# Patient Record
Sex: Male | Born: 1965 | Race: White | Hispanic: No | Marital: Married | Smoking: Never smoker
Health system: Southern US, Community
[De-identification: ages and names within clinical notes are randomized; demographics above are authoritative.]

## PROBLEM LIST (undated history)

## (undated) DIAGNOSIS — T7840XA Allergy, unspecified, initial encounter: Secondary | ICD-10-CM

## (undated) DIAGNOSIS — M5126 Other intervertebral disc displacement, lumbar region: Secondary | ICD-10-CM

## (undated) DIAGNOSIS — K449 Diaphragmatic hernia without obstruction or gangrene: Secondary | ICD-10-CM

## (undated) DIAGNOSIS — K222 Esophageal obstruction: Secondary | ICD-10-CM

## (undated) DIAGNOSIS — Z9109 Other allergy status, other than to drugs and biological substances: Secondary | ICD-10-CM

## (undated) DIAGNOSIS — K200 Eosinophilic esophagitis: Secondary | ICD-10-CM

## (undated) HISTORY — PX: APPENDECTOMY: SHX54

## (undated) HISTORY — DX: Esophageal obstruction: K22.2

## (undated) HISTORY — DX: Diaphragmatic hernia without obstruction or gangrene: K44.9

## (undated) HISTORY — DX: Allergy, unspecified, initial encounter: T78.40XA

## (undated) HISTORY — DX: Other allergy status, other than to drugs and biological substances: Z91.09

## (undated) HISTORY — DX: Eosinophilic esophagitis: K20.0

## (undated) HISTORY — PX: UPPER GASTROINTESTINAL ENDOSCOPY: SHX188

## (undated) HISTORY — DX: Other intervertebral disc displacement, lumbar region: M51.26

---

## 2005-09-25 ENCOUNTER — Emergency Department (HOSPITAL_COMMUNITY): Admission: EM | Admit: 2005-09-25 | Discharge: 2005-09-25 | Payer: Self-pay | Admitting: Emergency Medicine

## 2006-01-21 ENCOUNTER — Ambulatory Visit: Payer: Self-pay | Admitting: Family Medicine

## 2006-06-23 ENCOUNTER — Emergency Department (HOSPITAL_COMMUNITY): Admission: EM | Admit: 2006-06-23 | Discharge: 2006-06-23 | Payer: Self-pay | Admitting: Emergency Medicine

## 2007-04-12 ENCOUNTER — Ambulatory Visit: Payer: Self-pay | Admitting: Family Medicine

## 2007-04-14 ENCOUNTER — Ambulatory Visit: Payer: Self-pay | Admitting: Family Medicine

## 2007-04-16 ENCOUNTER — Ambulatory Visit: Payer: Self-pay | Admitting: Family Medicine

## 2007-04-21 ENCOUNTER — Ambulatory Visit: Payer: Self-pay | Admitting: Family Medicine

## 2007-04-29 ENCOUNTER — Ambulatory Visit: Payer: Self-pay | Admitting: Family Medicine

## 2007-07-14 DIAGNOSIS — J309 Allergic rhinitis, unspecified: Secondary | ICD-10-CM | POA: Insufficient documentation

## 2008-08-04 ENCOUNTER — Ambulatory Visit: Payer: Self-pay | Admitting: Family Medicine

## 2008-08-04 LAB — CONVERTED CEMR LAB
ALT: 21 units/L (ref 0–53)
AST: 11 units/L (ref 0–37)
Albumin: 4.1 g/dL (ref 3.5–5.2)
Alkaline Phosphatase: 55 units/L (ref 39–117)
BUN: 12 mg/dL (ref 6–23)
Basophils Absolute: 0 10*3/uL (ref 0.0–0.1)
Basophils Relative: 0.8 % (ref 0.0–3.0)
Bilirubin Urine: NEGATIVE
Bilirubin, Direct: 0.1 mg/dL (ref 0.0–0.3)
Blood in Urine, dipstick: NEGATIVE
CO2: 31 meq/L (ref 19–32)
Calcium: 8.9 mg/dL (ref 8.4–10.5)
Chloride: 105 meq/L (ref 96–112)
Cholesterol: 149 mg/dL (ref 0–200)
Creatinine, Ser: 1 mg/dL (ref 0.4–1.5)
Eosinophils Absolute: 0.2 10*3/uL (ref 0.0–0.7)
Eosinophils Relative: 4.5 % (ref 0.0–5.0)
GFR calc Af Amer: 106 mL/min
GFR calc non Af Amer: 88 mL/min
Glucose, Bld: 97 mg/dL (ref 70–99)
Glucose, Urine, Semiquant: NEGATIVE
HCT: 39 % (ref 39.0–52.0)
HDL: 28.6 mg/dL — ABNORMAL LOW (ref >39.0)
Hemoglobin: 13.9 g/dL (ref 13.0–17.0)
Ketones, urine, test strip: NEGATIVE
LDL Cholesterol: 91 mg/dL (ref 0–99)
Lymphocytes Relative: 19.9 % (ref 12.0–46.0)
MCHC: 35.8 g/dL (ref 30.0–36.0)
MCV: 88.9 fL (ref 78.0–100.0)
Monocytes Absolute: 0.3 10*3/uL (ref 0.1–1.0)
Monocytes Relative: 5.8 % (ref 3.0–12.0)
Neutro Abs: 3.5 10*3/uL (ref 1.4–7.7)
Neutrophils Relative %: 69 % (ref 43.0–77.0)
Nitrite: NEGATIVE
Platelets: 199 10*3/uL (ref 150–400)
Potassium: 4.4 meq/L (ref 3.5–5.1)
RBC: 4.39 M/uL (ref 4.22–5.81)
RDW: 12.2 % (ref 11.5–14.6)
Sodium: 141 meq/L (ref 135–145)
Specific Gravity, Urine: 1.02
TSH: 1.51 microintl units/mL (ref 0.35–5.50)
Total Bilirubin: 0.8 mg/dL (ref 0.3–1.2)
Total CHOL/HDL Ratio: 5.2
Total Protein: 6.4 g/dL (ref 6.0–8.3)
Triglycerides: 147 mg/dL (ref 0–149)
Urobilinogen, UA: 0.2
VLDL: 29 mg/dL (ref 0–40)
WBC Urine, dipstick: NEGATIVE
WBC: 5 10*3/uL (ref 4.5–10.5)
pH: 7

## 2008-08-09 ENCOUNTER — Encounter: Payer: Self-pay | Admitting: Family Medicine

## 2008-08-10 ENCOUNTER — Ambulatory Visit: Payer: Self-pay | Admitting: Family Medicine

## 2008-08-17 ENCOUNTER — Telehealth: Payer: Self-pay | Admitting: Family Medicine

## 2009-02-21 ENCOUNTER — Ambulatory Visit: Payer: Self-pay | Admitting: Family Medicine

## 2009-02-21 DIAGNOSIS — G5790 Unspecified mononeuropathy of unspecified lower limb: Secondary | ICD-10-CM | POA: Insufficient documentation

## 2009-02-23 ENCOUNTER — Telehealth: Payer: Self-pay | Admitting: Family Medicine

## 2009-02-23 LAB — CONVERTED CEMR LAB
ALT: 14 units/L (ref 0–53)
AST: 11 units/L (ref 0–37)
Albumin: 3.8 g/dL (ref 3.5–5.2)
Alkaline Phosphatase: 54 units/L (ref 39–117)
BUN: 10 mg/dL (ref 6–23)
Basophils Absolute: 0 10*3/uL (ref 0.0–0.1)
Basophils Relative: 1 % (ref 0.0–3.0)
Bilirubin, Direct: 0.1 mg/dL (ref 0.0–0.3)
CO2: 32 meq/L (ref 19–32)
Calcium: 8.9 mg/dL (ref 8.4–10.5)
Chloride: 110 meq/L (ref 96–112)
Creatinine, Ser: 0.8 mg/dL (ref 0.4–1.5)
Eosinophils Absolute: 0.3 10*3/uL (ref 0.0–0.7)
Eosinophils Relative: 6.6 % — ABNORMAL HIGH (ref 0.0–5.0)
Folate: 4.5 ng/mL
GFR calc non Af Amer: 113.34 mL/min (ref >60)
Glucose, Bld: 79 mg/dL (ref 70–99)
HCT: 38.1 % — ABNORMAL LOW (ref 39.0–52.0)
Hemoglobin: 13.6 g/dL (ref 13.0–17.0)
Iron: 115 ug/dL (ref 42–165)
Lymphocytes Relative: 22.6 % (ref 12.0–46.0)
Lymphs Abs: 1.1 10*3/uL (ref 0.7–4.0)
MCHC: 35.8 g/dL (ref 30.0–36.0)
MCV: 89.8 fL (ref 78.0–100.0)
Monocytes Absolute: 0.4 10*3/uL (ref 0.1–1.0)
Monocytes Relative: 7.5 % (ref 3.0–12.0)
Neutro Abs: 3.2 10*3/uL (ref 1.4–7.7)
Neutrophils Relative %: 62.3 % (ref 43.0–77.0)
Platelets: 177 10*3/uL (ref 150.0–400.0)
Potassium: 3.9 meq/L (ref 3.5–5.1)
RBC: 4.23 M/uL (ref 4.22–5.81)
RDW: 12.4 % (ref 11.5–14.6)
Saturation Ratios: 37 % (ref 20.0–50.0)
Sodium: 145 meq/L (ref 135–145)
TSH: 1.57 microintl units/mL (ref 0.35–5.50)
Total Bilirubin: 0.7 mg/dL (ref 0.3–1.2)
Total Protein: 6 g/dL (ref 6.0–8.3)
Transferrin: 222.2 mg/dL (ref 212.0–360.0)
Vitamin B-12: 225 pg/mL (ref 211–911)
WBC: 5 10*3/uL (ref 4.5–10.5)

## 2009-02-28 ENCOUNTER — Ambulatory Visit: Payer: Self-pay | Admitting: Family Medicine

## 2009-02-28 DIAGNOSIS — D518 Other vitamin B12 deficiency anemias: Secondary | ICD-10-CM | POA: Insufficient documentation

## 2009-08-16 ENCOUNTER — Ambulatory Visit: Payer: Self-pay | Admitting: Family Medicine

## 2009-08-16 DIAGNOSIS — M5137 Other intervertebral disc degeneration, lumbosacral region: Secondary | ICD-10-CM

## 2009-08-16 DIAGNOSIS — M51379 Other intervertebral disc degeneration, lumbosacral region without mention of lumbar back pain or lower extremity pain: Secondary | ICD-10-CM | POA: Insufficient documentation

## 2010-04-23 ENCOUNTER — Ambulatory Visit: Payer: Self-pay | Admitting: Family Medicine

## 2010-05-29 ENCOUNTER — Ambulatory Visit: Payer: Self-pay | Admitting: Internal Medicine

## 2010-06-11 ENCOUNTER — Encounter: Payer: Self-pay | Admitting: Internal Medicine

## 2010-07-18 ENCOUNTER — Encounter: Admission: RE | Admit: 2010-07-18 | Discharge: 2010-07-18 | Payer: Self-pay | Admitting: Pulmonary Disease

## 2010-08-27 ENCOUNTER — Ambulatory Visit
Admission: RE | Admit: 2010-08-27 | Discharge: 2010-08-27 | Payer: Self-pay | Source: Home / Self Care | Admitting: Otolaryngology

## 2010-12-03 NOTE — Letter (Signed)
Summary: [facility]  [facility]   Imported By: Maryln Gottron 07/29/2010 15:07:50  _____________________________________________________________________  External Attachment:    Type:   Image     Comment:   External Document

## 2010-12-03 NOTE — Assessment & Plan Note (Signed)
Summary: fu on allergies/njr   Vital Signs:  Patient profile:   45 year old male Weight:      215 pounds Temp:     98.2 degrees F oral BP sitting:   114 / 80  (right arm) Cuff size:   regular  Vitals Entered By: Duard Brady LPN (May 29, 2010 11:07 AM) CC: c/o cough - non productive , waking at night r/t sinus drainage - otc zyrtec/claritin not working Is Patient Diabetic? No   CC:  c/o cough - non productive  and waking at night r/t sinus drainage - otc zyrtec/claritin not working.  History of Present Illness: 45 year old patient who is seen today complaining of significant allergy symptoms.  He states that he has considerable rhinorrhea, causing largely nonproductive cough.  He states that he has a difficult time sleeping due to the drainage and associated coughing.  He has been treated with oral prednisone in the past without benefit.  He also states that nonsedating antihistamines have not been helpful.  He has been on nasal steroids in the past, also, without much benefit and he is requesting referral to an allergist.  He states that he has had the symptoms for 30 years.  There's been no fever or purulent sinus drainage or productive cough.  Denies any focal, sinus pain  Preventive Screening-Counseling & Management  Alcohol-Tobacco     Smoking Status: never  Allergies (verified): No Known Drug Allergies  Past History:  Past Medical History: Reviewed history from 07/14/2007 and no changes required. Allergic rhinitis  Review of Systems       The patient complains of prolonged cough.  The patient denies anorexia, fever, weight loss, weight gain, vision loss, decreased hearing, hoarseness, chest pain, syncope, dyspnea on exertion, peripheral edema, headaches, hemoptysis, abdominal pain, melena, hematochezia, severe indigestion/heartburn, hematuria, incontinence, genital sores, muscle weakness, suspicious skin lesions, transient blindness, difficulty walking, depression,  unusual weight change, abnormal bleeding, enlarged lymph nodes, angioedema, breast masses, and testicular masses.    Physical Exam  General:  Well-developed,well-nourished,in no acute distress; alert,appropriate and cooperative throughout examination Head:  Normocephalic and atraumatic without obvious abnormalities. No apparent alopecia or balding. Eyes:  No corneal or conjunctival inflammation noted. EOMI. Perrla. Funduscopic exam benign, without hemorrhages, exudates or papilledema. Vision grossly normal. Ears:  External ear exam shows no significant lesions or deformities.  Otoscopic examination reveals clear canals, tympanic membranes are intact bilaterally without bulging, retraction, inflammation or discharge. Hearing is grossly normal bilaterally. Mouth:  Oral mucosa and oropharynx without lesions or exudates.  Teeth in good repair. Neck:  No deformities, masses, or tenderness noted. Lungs:  Normal respiratory effort, chest expands symmetrically. Lungs are clear to auscultation, no crackles or wheezes. Heart:  Normal rate and regular rhythm. S1 and S2 normal without gallop, murmur, click, rub or other extra sounds.   Impression & Recommendations:  Problem # 1:  ALLERGIC RHINITIS (ICD-477.9)  His updated medication list for this problem includes:    Fluticasone Propionate 50 Mcg/act Susp (Fluticasone propionate) ..... Use daily  Orders: Allergy Referral  (Allergy) Depo- Medrol 80mg  (J1040) Admin of Therapeutic Inj  intramuscular or subcutaneous (69629)  Complete Medication List: 1)  Cyanocobalamin 1000 Mcg/ml Soln (Cyanocobalamin) .... Inject 1cc once a week for four weeks and then once a month thereafter 2)  Bd Eclipse Syringe 23g X 1" 3 Ml Misc (Syringe/needle (disp)) .... Use to inject b12 as directed by your doctor 3)  Fluticasone Propionate 50 Mcg/act Susp (Fluticasone propionate) .... Use daily  Patient Instructions: 1)  allergy referral as  discussed Prescriptions: FLUTICASONE PROPIONATE 50 MCG/ACT SUSP (FLUTICASONE PROPIONATE) use daily  #1 x 4   Entered and Authorized by:   Gordy Savers  MD   Signed by:   Gordy Savers  MD on 05/29/2010   Method used:   Electronically to        [facility]       [address]       [place], [place]  [address]       Ph: [phone]       Fax: [phone]   RxID:   [id number]    Medication Administration  Injection # 1:    Medication: Depo- Medrol 80mg     Diagnosis: ALLERGIC RHINITIS (ICD-477.9)    Route: IM    Site: R deltoid    Exp Date: 02/2013    Lot #: [id number]    Mfr: Pharmacia    Patient tolerated injection without complications    Given by: Duard Brady LPN (May 29, 2010 11:57 AM)  Orders Added: 1)  Est. Patient Level III [84132] 2)  Allergy Referral  [Allergy] 3)  Depo- Medrol 80mg  [J1040] 4)  Admin of Therapeutic Inj  intramuscular or subcutaneous [44010]

## 2010-12-03 NOTE — Letter (Signed)
Summary: Out of Work  [facility] [employer]  [address]   [place], [place] [address]   Phone: [phone]  Fax: [phone]    April 23, 2010   Employee:  Devin Ramos    To Whom It May Concern:   For Medical reasons, please excuse the above named employee from work for the following dates:  Start:   April 23, 2010  End:   April 24, 2010  If you need additional information, please feel free to contact our office.         Sincerely,    Kelle Darting, MD

## 2010-12-03 NOTE — Assessment & Plan Note (Signed)
Summary: URI/dm   Vital Signs:  Patient profile:   45 year old male Height:      74 inches Weight:      215 pounds BMI:     27.70 Temp:     99.0 degrees F oral BP sitting:   110 / 78  (left arm) Cuff size:   regular  Vitals Entered By: Kern Reap CMA Duncan Dull) (April 23, 2010 12:06 PM) CC: head and chest congestion   CC:  head and chest congestion.  History of Present Illness: ted is a 45 year old, married male, nonsmoker, who comes in today for a flare of his allergic rhinitis.  He has had congestion, postnasal drip, and cough.  No fever, et Karie Soda, et Karie Soda.  He does take Claritin daily because of perennial allergic rhinitis  Allergies: No Known Drug Allergies  Past History:  Past medical, surgical, family and social histories (including risk factors) reviewed for relevance to current acute and chronic problems.  Past Medical History: Reviewed history from 07/14/2007 and no changes required. Allergic rhinitis  Family History: Reviewed history from 08/10/2008 and no changes required. Family History Diabetes 1st degree relative Family History of Prostate CA 1st degree relative <50  Social History: Reviewed history from 08/10/2008 and no changes required. Occupation: Married Never Smoked Alcohol use-no Drug use-no Regular exercise-yes  Review of Systems      See HPI  Physical Exam  General:  Well-developed,well-nourished,in no acute distress; alert,appropriate and cooperative throughout examination Head:  Normocephalic and atraumatic without obvious abnormalities. No apparent alopecia or balding. Eyes:  No corneal or conjunctival inflammation noted. EOMI. Perrla. Funduscopic exam benign, without hemorrhages, exudates or papilledema. Vision grossly normal. Ears:  External ear exam shows no significant lesions or deformities.  Otoscopic examination reveals clear canals, tympanic membranes are intact bilaterally without bulging, retraction, inflammation or  discharge. Hearing is grossly normal bilaterally. Nose:  External nasal examination shows no deformity or inflammation. Nasal mucosa are pink and moist without lesions or exudates. Mouth:  Oral mucosa and oropharynx without lesions or exudates.  Teeth in good repair. Neck:  No deformities, masses, or tenderness noted. Chest Wall:  No deformities, masses, tenderness or gynecomastia noted. Lungs:  Normal respiratory effort, chest expands symmetrically. Lungs are clear to auscultation, no crackles or wheezes.   Impression & Recommendations:  Problem # 1:  ALLERGIC RHINITIS (ICD-477.9) Assessment Deteriorated  Orders: Prescription Created Electronically [phone])  Complete Medication List: 1)  Cyanocobalamin 1000 Mcg/ml Soln (Cyanocobalamin) .... Inject 1cc once a week for four weeks and then once a month thereafter 2)  Bd Eclipse Syringe 23g X 1" 3 Ml Misc (Syringe/needle (disp)) .... Use to inject b12 as directed by your doctor 3)  Prednisone 20 Mg Tabs (Prednisone) .... Uad  Patient Instructions: 1)  stop the Claritin temporarily, and begin prednisone one tab x 3 days, a half a tablet x 3 days, then half a tablet Monday, Wednesday, Friday, for a two week taper.  When he finished the prednisone, then restart the Claritin Prescriptions: PREDNISONE 20 MG TABS (PREDNISONE) UAD  #30 x 1   Entered and Authorized by:   Roderick Pee MD   Signed by:   Roderick Pee MD on 04/23/2010   Method used:   Electronically to        [facility]       [address]       [place], [place]  [address]  Ph: [phone]       Fax: [phone]   RxID:   [id number]

## 2011-01-15 LAB — POCT HEMOGLOBIN-HEMACUE: Hemoglobin: 14.2 g/dL (ref 13.0–17.0)

## 2011-11-18 ENCOUNTER — Ambulatory Visit (INDEPENDENT_AMBULATORY_CARE_PROVIDER_SITE_OTHER): Payer: BC Managed Care – PPO | Admitting: Family Medicine

## 2011-11-18 ENCOUNTER — Encounter: Payer: Self-pay | Admitting: Family Medicine

## 2011-11-18 VITALS — BP 98/62 | Temp 98.5°F | Ht 74.0 in | Wt 216.0 lb

## 2011-11-18 DIAGNOSIS — J069 Acute upper respiratory infection, unspecified: Secondary | ICD-10-CM

## 2011-11-18 MED ORDER — HYDROCODONE-HOMATROPINE 5-1.5 MG/5ML PO SYRP: ORAL_SOLUTION | ORAL | Status: DC

## 2011-11-18 NOTE — Patient Instructions (Signed)
Drink lots of liquids.  Run a vaporizer or humidifier in your bedroom at night.  Hydromet one half to 1 teaspoon at bedtime p.r.n. For cough and cold symptoms.  Afrin nasal spray, one shot up each nostril at bedtime for the next 5 nights, then stop

## 2011-11-18 NOTE — Progress Notes (Signed)
  Subjective:    Patient ID: Devin Ramos, male    DOB: 02-12-1966, 46 y.o.   MRN: [id number]  HPI Devin Ramos is a 46 year old, married man, nonsmoker, who comes in today with a week's history of head congestion, postnasal drip, and cough.  He has no fever or aches et Karie Soda.  He is a nonsmoker.   Review of Systems    General ENT and pulmonary review of systems otherwise negative Objective:   Physical Exam  Well-developed and nourished, male in no acute distress.  HEENT negative.  Neck supple.  No adenopathy.  Lungs are clear      Assessment & Plan:  DOI asymptomatically with liquids, Tylenol Hydromet

## 2012-02-20 ENCOUNTER — Telehealth: Payer: Self-pay | Admitting: *Deleted

## 2012-02-20 NOTE — Telephone Encounter (Signed)
Suggest  Saturday clinic

## 2012-02-20 NOTE — Telephone Encounter (Signed)
Pt works Saturday also, so he will try an Urgent Care after work hours.

## 2012-02-20 NOTE — Telephone Encounter (Signed)
Pt has been using Claritin and Tylenol Sinus for 2 weeks with no improvement.  Has congestion with cough (green), and nasal congestion.  Asking to be treated by phone as he cannot get off work for one week.

## 2012-10-06 ENCOUNTER — Encounter (HOSPITAL_BASED_OUTPATIENT_CLINIC_OR_DEPARTMENT_OTHER): Payer: Self-pay | Admitting: *Deleted

## 2012-10-06 ENCOUNTER — Emergency Department (HOSPITAL_BASED_OUTPATIENT_CLINIC_OR_DEPARTMENT_OTHER)
Admission: EM | Admit: 2012-10-06 | Discharge: 2012-10-06 | Disposition: A | Discharge status: Home / Self Care | Payer: Self-pay | Attending: Emergency Medicine | Admitting: Emergency Medicine

## 2012-10-06 ENCOUNTER — Emergency Department (HOSPITAL_BASED_OUTPATIENT_CLINIC_OR_DEPARTMENT_OTHER): Payer: Self-pay

## 2012-10-06 DIAGNOSIS — S52599A Other fractures of lower end of unspecified radius, initial encounter for closed fracture: Secondary | ICD-10-CM | POA: Insufficient documentation

## 2012-10-06 DIAGNOSIS — S99929A Unspecified injury of unspecified foot, initial encounter: Secondary | ICD-10-CM | POA: Insufficient documentation

## 2012-10-06 DIAGNOSIS — S8990XA Unspecified injury of unspecified lower leg, initial encounter: Secondary | ICD-10-CM | POA: Insufficient documentation

## 2012-10-06 DIAGNOSIS — W11XXXA Fall on and from ladder, initial encounter: Secondary | ICD-10-CM | POA: Insufficient documentation

## 2012-10-06 DIAGNOSIS — Y939 Activity, unspecified: Secondary | ICD-10-CM | POA: Insufficient documentation

## 2012-10-06 DIAGNOSIS — S52509A Unspecified fracture of the lower end of unspecified radius, initial encounter for closed fracture: Secondary | ICD-10-CM

## 2012-10-06 DIAGNOSIS — Y929 Unspecified place or not applicable: Secondary | ICD-10-CM | POA: Insufficient documentation

## 2012-10-06 MED ORDER — HYDROCODONE-ACETAMINOPHEN 5-325 MG PO TABS: 1.0000 | ORAL_TABLET | Freq: Four times a day (QID) | ORAL | Status: DC | PRN

## 2012-10-06 MED ORDER — HYDROCODONE-ACETAMINOPHEN 5-325 MG PO TABS
1.0000 | ORAL_TABLET | Freq: Once | ORAL | Status: AC
  Administered 2012-10-06: 1 via ORAL
  Filled 2012-10-06: qty 1

## 2012-10-06 NOTE — ED Notes (Signed)
Fall from 2nd step on ladder x 1 day ago, c/o right wrist , left foot pain

## 2012-10-06 NOTE — ED Notes (Signed)
MD at bedside. EDP [place] at [facility] r/t family member concerns with velcro splint

## 2012-10-06 NOTE — ED Notes (Signed)
MD at bedside. 

## 2012-10-06 NOTE — ED Provider Notes (Signed)
History     CSN: [id number]  Arrival date & time 10/06/12  1757   First MD Initiated Contact with Patient 10/06/12 1901      Chief Complaint  Patient presents with  . Fall    (Consider location/radiation/quality/duration/timing/severity/associated sxs/prior treatment) HPI The patient presents one day after a fall with pain in his right wrist, left foot.  He states that he was on a ladder, lost his balance, fell onto his right side, catching himself with his right arm.  Since that time there's been pain in his right wrist, left foot.  He has been ambulatory.  The pain in both areas is sore, not improved with OTC medication.  He denies distal dysesthesia or weakness.  He denies any other head trauma, loss of consciousness, any other focal complaints.    History reviewed. No pertinent past medical history.  Past Surgical History  Procedure Date  . Appendectomy     History reviewed. No pertinent family history.  History  Substance Use Topics  . Smoking status: Never Smoker   . Smokeless tobacco: Not on file  . Alcohol Use: No      Review of Systems  All other systems reviewed and are negative.    Allergies  Review of patient's allergies indicates no known allergies.  Home Medications   Current Outpatient Rx  Name  Route  Sig  Dispense  Refill  . CYANOCOBALAMIN 1000 MCG/ML IJ SOLN   Intramuscular   Inject 1,000 mcg into the muscle every 30 (thirty) days.         [facility] HYDROCODONE-ACETAMINOPHEN 5-325 MG PO TABS   Oral   Take 1 tablet by mouth every 6 (six) hours as needed for pain.   15 tablet   0   . HYDROCODONE-HOMATROPINE 5-1.5 MG/5ML PO SYRP      One half to 1 teaspoon at bedtime p.r.n. For cough and cold   240 mL   1     BP 141/73  Pulse 80  Temp 98.2 F (36.8 C) (Oral)  Resp 16  Ht 6\' 2"  (1.88 m)  Wt 215 lb (97.523 kg)  BMI 27.60 kg/m2  SpO2 99%  Physical Exam  Nursing note and vitals reviewed. Constitutional: He is oriented to person,  place, and time. He appears well-developed. No distress.  HENT:  Head: Normocephalic and atraumatic.  Eyes: Conjunctivae normal and EOM are normal.  Cardiovascular: Normal rate and regular rhythm.   Pulmonary/Chest: Effort normal. No stridor. No respiratory distress.  Abdominal: He exhibits no distension.  Musculoskeletal: He exhibits no edema.       Right elbow: Normal.      Right wrist: He exhibits tenderness, bony tenderness and swelling. He exhibits no crepitus and no laceration.       Left wrist: Normal.       Left ankle: Normal.       Feet:       The patient has tenderness to palpation diffusely about the wrist, hesitancy demented, though neurologically he can move it in all dimensions, minimally.  The patient can also flex and extend all digits on the hand.  There is tenderness to palpation about the distal fourth digit, with slight ecchymosis, but no loss of function of the digits.  Neurological: He is alert and oriented to person, place, and time.  Skin: Skin is warm and dry.  Psychiatric: He has a normal mood and affect.    ED Course  ORTHOPEDIC INJURY TREATMENT Date/Time: 10/06/2012 7:25 PM Performed by:  Gerhard Munch Authorized by: Gerhard Munch Consent: Verbal consent obtained. The procedure was performed in an emergent situation. Risks and benefits: risks, benefits and alternatives were discussed Consent given by: patient Patient identity confirmed: verbally with patient Time out: Immediately prior to procedure a "time out" was called to verify the correct patient, procedure, equipment, support staff and site/side marked as required. Injury location: wrist Location details: right wrist Injury type: fracture Fracture type: distal radius Pre-procedure neurovascular assessment: neurovascularly intact Pre-procedure distal perfusion: normal Pre-procedure neurological function: normal Pre-procedure range of motion: reduced Local anesthesia used: no Patient sedated:  no Manipulation performed: no Immobilization: splint Splint type: volar short arm Post-procedure neurovascular assessment: post-procedure neurovascularly intact Post-procedure distal perfusion: normal Post-procedure neurological function: normal Post-procedure range of motion: unchanged Patient tolerance: Patient tolerated the procedure well with no immediate complications.   (including critical care time)  Labs Reviewed - No data to display Dg Wrist Complete Right  10/06/2012  *RADIOLOGY REPORT*  Clinical Data: Fall.  Wrist injury and pain.  RIGHT WRIST - COMPLETE 3+ VIEW  Comparison: None.  Findings: A nondisplaced fracture is seen through the radial styloid process with intra-articular extension into the radiocarpal joint.  No other fractures are identified.  Alignment is normal.  IMPRESSION: Nondisplaced radial styloid process fracture with intra-articular extension.   Original Report Authenticated By: Myles Rosenthal, M.D.    Dg Hand Complete Right  10/06/2012  *RADIOLOGY REPORT*  Clinical Data: Fall  RIGHT HAND - COMPLETE 3+ VIEW  Comparison: Right wrist same day  Findings: Three views of the right hand submitted. No hand fracture or subluxation.  Again noted nondisplaced fracture of distal right radius.  IMPRESSION: No hand fracture or subluxation.   Original Report Authenticated By: Natasha Mead, M.D.    Dg Foot Complete Left  10/06/2012  *RADIOLOGY REPORT*  Clinical Data: Fall.  Foot injury and pain.  LEFT FOOT - COMPLETE 3+ VIEW  Comparison:  None.  Findings:  There is no evidence of fracture or dislocation.  There is no evidence of arthropathy or other focal bone abnormality. Soft tissues are unremarkable.  IMPRESSION: Negative.   Original Report Authenticated By: Myles Rosenthal, M.D.      1. Radius distal fracture     I interpreted the x-rays, demonstrated them to the patient and his wife.  MDM  This previously well male presents one day after a fall with pain in his right wrist, right  fourth digit, left third toe.  Patient's x-rays demonstrate fracture of the distal radius.  Given the patient's need to continue working, he received a short arm immobilization device.  I discussed the need for minimal stress of the extremity.  The patient was discharged in stable condition with analgesics, orthopedics followup     Gerhard Munch, MD 10/06/12 [phone]

## 2013-04-21 ENCOUNTER — Ambulatory Visit (INDEPENDENT_AMBULATORY_CARE_PROVIDER_SITE_OTHER): Payer: BC Managed Care – PPO | Admitting: Family Medicine

## 2013-04-21 ENCOUNTER — Encounter: Payer: Self-pay | Admitting: Family Medicine

## 2013-04-21 VITALS — BP 106/60 | HR 71 | Temp 98.8°F | Ht 74.0 in | Wt 219.6 lb

## 2013-04-21 DIAGNOSIS — Z0000 Encounter for general adult medical examination without abnormal findings: Secondary | ICD-10-CM

## 2013-04-21 NOTE — Patient Instructions (Signed)
Preventive Care for Adults, Male  A healthy lifestyle and preventive care can promote health and wellness. Preventive health guidelines for men include the following key practices:  · A routine yearly physical is a good way to check with your caregiver about your health and preventative screening. It is a chance to share any concerns and updates on your health, and to receive a thorough exam.  · Visit your dentist for a routine exam and preventative care every 6 months. Brush your teeth twice a day and floss once a day. Good oral hygiene prevents tooth decay and gum disease.  · The frequency of eye exams is based on your age, health, family medical history, use of contact lenses, and other factors. Follow your caregiver's recommendations for frequency of eye exams.  · Eat a healthy diet. Foods like vegetables, fruits, whole grains, low-fat dairy products, and lean protein foods contain the nutrients you need without too many calories. Decrease your intake of foods high in solid fats, added sugars, and salt. Eat the right amount of calories for you. Get information about a proper diet from your caregiver, if necessary.  · Regular physical exercise is one of the most important things you can do for your health. Most adults should get at least 150 minutes of moderate-intensity exercise (any activity that increases your heart rate and causes you to sweat) each week. In addition, most adults need muscle-strengthening exercises on 2 or more days a week.  · Maintain a healthy weight. The body mass index (BMI) is a screening tool to identify possible weight problems. It provides an estimate of body fat based on height and weight. Your caregiver can help determine your BMI, and can help you achieve or maintain a healthy weight. For adults 20 years and older:  · A BMI below 18.5 is considered underweight.  · A BMI of 18.5 to 24.9 is normal.  · A BMI of 25 to 29.9 is considered overweight.  · A BMI of 30 and above is  considered obese.  · Maintain normal blood lipids and cholesterol levels by exercising and minimizing your intake of saturated fat. Eat a balanced diet with plenty of fruit and vegetables. Blood tests for lipids and cholesterol should begin at age 20 and be repeated every 5 years. If your lipid or cholesterol levels are high, you are over 50, or you are a high risk for heart disease, you may need your cholesterol levels checked more frequently. Ongoing high lipid and cholesterol levels should be treated with medicines if diet and exercise are not effective.  · If you smoke, find out from your caregiver how to quit. If you do not use tobacco, do not start.  · If you choose to drink alcohol, do not exceed 2 drinks per day. One drink is considered to be 12 ounces (355 mL) of beer, 5 ounces (148 mL) of wine, or 1.5 ounces (44 mL) of liquor.  · Avoid use of street drugs. Do not share needles with anyone. Ask for help if you need support or instructions about stopping the use of drugs.  · High blood pressure causes heart disease and increases the risk of stroke. Your blood pressure should be checked at least every 1 to 2 years. Ongoing high blood pressure should be treated with medicines, if weight loss and exercise are not effective.  · If you are 45 to 45 years old, ask your caregiver if you should take aspirin to prevent heart disease.  · Diabetes screening involves taking   a blood sample to check your fasting blood sugar level. This should be done once every 3 years, after age 45, if you are within normal weight and without risk factors for diabetes. Testing should be considered at a younger age or be carried out more frequently if you are overweight and have at least 1 risk factor for diabetes.  · Colorectal cancer can be detected and often prevented. Most routine colorectal cancer screening begins at the age of 50 and continues through age 75. However, your caregiver may recommend screening at an earlier age if you  have risk factors for colon cancer. On a yearly basis, your caregiver may provide home test kits to check for hidden blood in the stool. Use of a small camera at the end of a tube, to directly examine the colon (sigmoidoscopy or colonoscopy), can detect the earliest forms of colorectal cancer. Talk to your caregiver about this at age 50, when routine screening begins.  Direct examination of the colon should be repeated every 5 to 10 years through age 75, unless early forms of pre-cancerous polyps or small growths are found.  · Hepatitis C blood testing is recommended for all people born from 1945 through 1965 and any individual with known risks for hepatitis C.  · Practice safe sex. Use condoms and avoid high-risk sexual practices to reduce the spread of sexually transmitted infections (STIs). STIs include gonorrhea, chlamydia, syphilis, trichomonas, herpes, HPV, and human immunodeficiency virus (HIV). Herpes, HIV, and HPV are viral illnesses that have no cure. They can result in disability, cancer, and death.  · A one-time screening for abdominal aortic aneurysm (AAA) and surgical repair of large AAAs by sound wave imaging (ultrasonography) is recommended for ages 65 to 75 years who are current or former smokers.  · Healthy men should no longer receive prostate-specific antigen (PSA) blood tests as part of routine cancer screening. Consult with your caregiver about prostate cancer screening.  · Testicular cancer screening is not recommended for adult males who have no symptoms. Screening includes self-exam, caregiver exam, and other screening tests. Consult with your caregiver about any symptoms you have or any concerns you have about testicular cancer.  · Use sunscreen with skin protection factor (SPF) of 30 or more. Apply sunscreen liberally and repeatedly throughout the day. You should seek shade when your shadow is shorter than you. Protect yourself by wearing long sleeves, pants, a wide-brimmed hat, and  sunglasses year round, whenever you are outdoors.  · Once a month, do a whole body skin exam, using a mirror to look at the skin on your back. Notify your caregiver of new moles, moles that have irregular borders, moles that are larger than a pencil eraser, or moles that have changed in shape or color.  · Stay current with required immunizations.  · Influenza. You need a dose every fall (or winter). The composition of the flu vaccine changes each year, so being vaccinated once is not enough.  · Pneumococcal polysaccharide. You need 1 to 2 doses if you smoke cigarettes or if you have certain chronic medical conditions. You need 1 dose at age 65 (or older) if you have never been vaccinated.  · Tetanus, diphtheria, pertussis (Tdap, Td). Get 1 dose of Tdap vaccine if you are younger than age 65 years, are over 65 and have contact with an infant, are a healthcare worker, or simply want to be protected from whooping cough. After that, you need a Td booster dose every 10 years. Consult your   caregiver if you have not had at least 3 tetanus and diphtheria-containing shots sometime in your life or have a deep or dirty wound.  · HPV. This vaccine is recommended for males 13 through 45 years of age. This vaccine may be given to men 22 through 45 years of age who have not completed the 3 dose series. It is recommended for men through age 26 who have sex with men or whose immune system is weakened because of HIV infection, other illness, or medications. The vaccine is given in 3 doses over 6 months.  · Measles, mumps, rubella (MMR). You need at least 1 dose of MMR if you were born in 1957 or later. You may also need a 2nd dose.  · Meningococcal. If you are age 19 to 21 years and a first-year college student living in a residence hall, or have one of several medical conditions, you need to get vaccinated against meningococcal disease. You may also need additional booster doses.  · Zoster (shingles). If you are age 60 years or  older, you should get this vaccine.  · Varicella (chickenpox). If you have never had chickenpox or you were vaccinated but received only 1 dose, talk to your caregiver to find out if you need this vaccine.  · Hepatitis A. You need this vaccine if you have a specific risk factor for hepatitis A virus infection, or you simply wish to be protected from this disease. The vaccine is usually given as 2 doses, 6 to 18 months apart.  · Hepatitis B. You need this vaccine if you have a specific risk factor for hepatitis B virus infection or you simply wish to be protected from this disease. The vaccine is given in 3 doses, usually over 6 months.  Preventative Service / Frequency  Ages 19 to 39  · Blood pressure check.** / Every 1 to 2 years.  · Lipid and cholesterol check.** / Every 5 years beginning at age 20.  · Hepatitis C blood test.** / For any individual with known risks for hepatitis C.  · Skin self-exam. / Monthly.  · Influenza immunization.** / Every year.  · Pneumococcal polysaccharide immunization.** / 1 to 2 doses if you smoke cigarettes or if you have certain chronic medical conditions.  · Tetanus, diphtheria, pertussis (Tdap,Td) immunization. / A one-time dose of Tdap vaccine. After that, you need a Td booster dose every 10 years.  · HPV immunization. / 3 doses over 6 months, if 26 and younger.  · Measles, mumps, rubella (MMR) immunization. / You need at least 1 dose of MMR if you were born in 1957 or later. You may also need a 2nd dose.  · Meningococcal immunization. / 1 dose if you are age 19 to 21 years and a first-year college student living in a residence hall, or have one of several medical conditions, you need to get vaccinated against meningococcal disease. You may also need additional booster doses.  · Varicella immunization.** / Consult your caregiver.  · Hepatitis A immunization.** / Consult your caregiver. 2 doses, 6 to 18 months apart.  · Hepatitis B immunization.** / Consult your caregiver. 3 doses  usually over 6 months.  Ages 40 to 64  · Blood pressure check.** / Every 1 to 2 years.  · Lipid and cholesterol check.** / Every 5 years beginning at age 20.  · Fecal occult blood test (FOBT) of stool. / Every year beginning at age 50 and continuing until age 75. You may not have   to do this test if you get colonoscopy every 10 years.  · Flexible sigmoidoscopy** or colonoscopy.** / Every 5 years for a flexible sigmoidoscopy or every 10 years for a colonoscopy beginning at age 50 and continuing until age 75.  · Hepatitis C blood test.** / For all people born from 1945 through 1965 and any individual with known risks for hepatitis C.  · Skin self-exam. / Monthly.  · Influenza immunization.** / Every year.  · Pneumococcal polysaccharide immunization.** / 1 to 2 doses if you smoke cigarettes or if you have certain chronic medical conditions.  · Tetanus, diphtheria, pertussis (Tdap/Td) immunization.** / A one-time dose of Tdap vaccine. After that, you need a Td booster dose every 10 years.  · Measles, mumps, rubella (MMR) immunization.  / You need at least 1 dose of MMR if you were born in 1957 or later. You may also need a 2nd dose.  · Varicella immunization.**/ Consult your caregiver.  · Meningococcal immunization.** / Consult your caregiver.  · Hepatitis A immunization.** / Consult your caregiver. 2 doses, 6 to 18 months apart.  · Hepatitis B immunization.** / Consult your caregiver. 3 doses, usually over 6 months.  Ages 65 and over  · Blood pressure check.** / Every 1 to 2 years.  · Lipid and cholesterol check.**/ Every 5 years beginning at age 20.  · Fecal occult blood test (FOBT) of stool. / Every year beginning at age 50 and continuing until age 75. You may not have to do this test if you get colonoscopy every 10 years.  · Flexible sigmoidoscopy** or colonoscopy.** / Every 5 years for a flexible sigmoidoscopy or every 10 years for a colonoscopy beginning at age 50 and continuing until age 75.  · Hepatitis C blood  test.** / For all people born from 1945 through 1965 and any individual with known risks for hepatitis C.  · Abdominal aortic aneurysm (AAA) screening.** / A one-time screening for ages 65 to 75 years who are current or former smokers.  · Skin self-exam. / Monthly.  · Influenza immunization.** / Every year.  · Pneumococcal polysaccharide immunization.** / 1 dose at age 65 (or older) if you have never been vaccinated.  · Tetanus, diphtheria, pertussis (Tdap, Td) immunization. / A one-time dose of Tdap vaccine if you are over 65 and have contact with an infant, are a healthcare worker, or simply want to be protected from whooping cough. After that, you need a Td booster dose every 10 years.  · Varicella immunization. ** / Consult your caregiver.  · Meningococcal immunization.** / Consult your caregiver.  · Hepatitis A immunization. ** / Consult your caregiver. 2 doses, 6 to 18 months apart.  · Hepatitis B immunization.** / Check with your caregiver. 3 doses, usually over 6 months.  **Family history and personal history of risk and conditions may change your caregiver's recommendations.  Document Released: 12/16/2001 Document Revised: 01/12/2012 Document Reviewed: 03/17/2011  [employer]® Patient Information ©2014 [employer].

## 2013-04-21 NOTE — Progress Notes (Signed)
Subjective:    Patient ID: Devin Ramos, male    DOB: 07-Apr-1966, 47 y.o.   MRN: [id number]  HPI Pt here for cpe and labs.  No complaints.     Review of Systems Review of Systems  Constitutional: Negative for activity change, appetite change and fatigue.  HENT: Negative for hearing loss, congestion, tinnitus and ear discharge.  dentist q86m Eyes: Negative for visual disturbance (see optho q1y -- vision corrected to 20/20 with glasses).  Respiratory: Negative for cough, chest tightness and shortness of breath.   Cardiovascular: Negative for chest pain, palpitations and leg swelling.  Gastrointestinal: Negative for abdominal pain, diarrhea, constipation and abdominal distention.  Genitourinary: Negative for urgency, frequency, decreased urine volume and difficulty urinating.  Musculoskeletal: Negative for back pain, arthralgias and gait problem.  Skin: Negative for color change, pallor and rash.  Neurological: Negative for dizziness, light-headedness, numbness and headaches.  Hematological: Negative for adenopathy. Does not bruise/bleed easily.  Psychiatric/Behavioral: Negative for suicidal ideas, confusion, sleep disturbance, self-injury, dysphoric mood, decreased concentration and agitation.     Past Medical History  Diagnosis Date  . Environmental allergies    History   Social History  . Marital Status: Married    Spouse Name: N/A    Number of Children: N/A  . Years of Education: N/A   Occupational History  . [employer]    Social History Main Topics  . Smoking status: Never Smoker   . Smokeless tobacco: Not on file  . Alcohol Use: No     Comment: rare  . Drug Use: No  . Sexually Active: Yes -- Male partner(s)   Other Topics Concern  . Not on file   Social History Narrative   Exercise-- walking ,  Physical job   Family History  Problem Relation Age of Onset  . Prostate cancer Father   . Hypertension Father   . Heart disease Paternal Grandfather   .  Stroke Paternal Grandfather   . Hypertension Mother   . Diabetes Mother    Past Surgical History  Procedure Laterality Date  . Appendectomy         Objective:   Physical Exam BP 106/60  Pulse 71  Temp(Src) 98.8 F (37.1 C) (Oral)  Ht 6\' 2"  (1.88 m)  Wt 219 lb 9.6 oz (99.61 kg)  BMI 28.18 kg/m2  SpO2 97% General appearance: alert, cooperative, appears stated age and no distress Head: Normocephalic, without obvious abnormality, atraumatic Eyes: negative findings: lids and lashes normal and pupils equal, round, reactive to light and accomodation Ears: normal TM's and external ear canals both ears Nose: Nares normal. Septum midline. Mucosa normal. No drainage or sinus tenderness. Throat: lips, mucosa, and tongue normal; teeth and gums normal Neck: no adenopathy, no carotid bruit, no JVD, supple, symmetrical, trachea midline and thyroid not enlarged, symmetric, no tenderness/mass/nodules Back: symmetric, no curvature. ROM normal. No CVA tenderness. Lungs: clear to auscultation bilaterally Chest wall: no tenderness Heart: S1, S2 normal Abdomen: soft, non-tender; bowel sounds normal; no masses,  no organomegaly Male genitalia: normal, penis: no lesions or discharge. testes: no masses or tenderness. no hernias Rectal: normal tone, normal prostate, no masses or tenderness and soft brown guaiac negative stool noted Extremities: extremities normal, atraumatic, no cyanosis or edema Pulses: 2+ and symmetric Skin: Skin color, texture, turgor normal. No rashes or lesions Lymph nodes: Cervical, supraclavicular, and axillary nodes normal. Neurologic: Alert and oriented X 3, normal strength and tone. Normal symmetric reflexes. Normal coordination and gait Psych- no depression, no  anxiety       Assessment & Plan:  cpe--  ghm utd           Check fasting labs            See AVS

## 2013-04-28 ENCOUNTER — Other Ambulatory Visit (INDEPENDENT_AMBULATORY_CARE_PROVIDER_SITE_OTHER): Payer: BC Managed Care – PPO

## 2013-04-28 DIAGNOSIS — Z0000 Encounter for general adult medical examination without abnormal findings: Secondary | ICD-10-CM

## 2013-04-28 LAB — CBC WITH DIFFERENTIAL/PLATELET
Basophils Absolute: 0.1 10*3/uL (ref 0.0–0.1)
Basophils Relative: 1.3 % (ref 0.0–3.0)
Eosinophils Absolute: 0.3 10*3/uL (ref 0.0–0.7)
Eosinophils Relative: 5.6 % — ABNORMAL HIGH (ref 0.0–5.0)
HCT: 37.8 % — ABNORMAL LOW (ref 39.0–52.0)
Hemoglobin: 13.1 g/dL (ref 13.0–17.0)
Lymphocytes Relative: 21.7 % (ref 12.0–46.0)
MCHC: 34.8 g/dL (ref 30.0–36.0)
MCV: 89.5 fl (ref 78.0–100.0)
Monocytes Absolute: 0.3 10*3/uL (ref 0.1–1.0)
Monocytes Relative: 5 % (ref 3.0–12.0)
Neutro Abs: 3.5 10*3/uL (ref 1.4–7.7)
Neutrophils Relative %: 66.4 % (ref 43.0–77.0)
Platelets: 180 10*3/uL (ref 150.0–400.0)
RBC: 4.22 Mil/uL (ref 4.22–5.81)
RDW: 13.3 % (ref 11.5–14.6)
WBC: 5.2 10*3/uL (ref 4.5–10.5)

## 2013-04-28 LAB — LIPID PANEL
Cholesterol: 158 mg/dL (ref 0–200)
HDL: 29.9 mg/dL — ABNORMAL LOW (ref >39.00)
LDL Cholesterol: 101 mg/dL — ABNORMAL HIGH (ref 0–99)
Total CHOL/HDL Ratio: 5
Triglycerides: 134 mg/dL (ref 0.0–149.0)
VLDL: 26.8 mg/dL (ref 0.0–40.0)

## 2013-04-28 LAB — HEPATIC FUNCTION PANEL
ALT: 17 U/L (ref 0–53)
AST: 10 U/L (ref 0–37)
Albumin: 4.1 g/dL (ref 3.5–5.2)
Alkaline Phosphatase: 55 U/L (ref 39–117)
Total Bilirubin: 0.5 mg/dL (ref 0.3–1.2)

## 2013-04-28 LAB — BASIC METABOLIC PANEL
BUN: 13 mg/dL (ref 6–23)
CO2: 27 mEq/L (ref 19–32)
Calcium: 9 mg/dL (ref 8.4–10.5)
Creatinine, Ser: 0.8 mg/dL (ref 0.4–1.5)
GFR: 105.03 mL/min (ref >60.00)
Glucose, Bld: 99 mg/dL (ref 70–99)
Potassium: 4.2 mEq/L (ref 3.5–5.1)
Sodium: 140 mEq/L (ref 135–145)

## 2013-04-28 LAB — TSH: TSH: 1.67 u[IU]/mL (ref 0.35–5.50)

## 2013-04-28 LAB — PSA: PSA: 0.43 ng/mL (ref 0.10–4.00)

## 2013-09-16 ENCOUNTER — Encounter: Payer: Self-pay | Admitting: Family Medicine

## 2013-09-16 ENCOUNTER — Ambulatory Visit (INDEPENDENT_AMBULATORY_CARE_PROVIDER_SITE_OTHER): Payer: BC Managed Care – PPO | Admitting: Family Medicine

## 2013-09-16 VITALS — BP 118/80 | HR 78 | Temp 97.1°F | Wt 222.0 lb

## 2013-09-16 DIAGNOSIS — M79642 Pain in left hand: Secondary | ICD-10-CM | POA: Insufficient documentation

## 2013-09-16 DIAGNOSIS — E538 Deficiency of other specified B group vitamins: Secondary | ICD-10-CM

## 2013-09-16 DIAGNOSIS — M79609 Pain in unspecified limb: Secondary | ICD-10-CM

## 2013-09-16 NOTE — Progress Notes (Signed)
Pre visit review using our clinic review tool, if applicable. No additional management support is needed unless otherwise documented below in the visit note. 

## 2013-09-16 NOTE — Patient Instructions (Addendum)
                                         rto prn  If symptoms occur again --call the office and we will refer you to a hand specialist

## 2013-09-16 NOTE — Assessment & Plan Note (Signed)
?   Carpal spasm If occurs again we will refer to hand specialist

## 2013-09-17 NOTE — Progress Notes (Signed)
  Subjective:    Devin Ramos is a 47 y.o. male who presents for evaluation of left hand/finger pain. Onset was sudden, related to working overhead under car. Mechanism of injury: gripping and repetitive use pattern. The pain was moderate, worsens with movement, and is relieved by time. There is associated numbness, tingling in L palm and fingers. Evaluation to date: none. Treatment to date: nothing specific.  The following portions of the patient's history were reviewed and updated as appropriate: allergies, current medications, past family history, past medical history, past social history, past surgical history and problem list.  Review of Systems Pertinent items are noted in HPI.    Objective:    BP 118/80  Pulse 78  Temp(Src) 97.1 F (36.2 C) (Tympanic)  Wt 222 lb (100.699 kg)  SpO2 97% Right hand:  normal exam, no swelling, tenderness, instability; ligaments intact, full ROM both hands, wrists, and finger joints  Left hand:  normal exam, no swelling, tenderness, instability; ligaments intact, full ROM both hands, wrists, and finger joints   Imaging: X-ray left hand: not indicated    Assessment:  L hand pain--  carpal spasm--?    Plan:    Natural history and expected course discussed. Questions answered. Transport planner distributed. Rest, ice, compression, and elevation (RICE) therapy. Reduction in offending activity discussed. will refer to hand surgeon if occurs again

## 2013-09-28 ENCOUNTER — Emergency Department (HOSPITAL_BASED_OUTPATIENT_CLINIC_OR_DEPARTMENT_OTHER)
Admission: EM | Admit: 2013-09-28 | Discharge: 2013-09-28 | Disposition: A | Discharge status: Home / Self Care | Payer: BC Managed Care – PPO | Attending: Emergency Medicine | Admitting: Emergency Medicine

## 2013-09-28 ENCOUNTER — Encounter (HOSPITAL_BASED_OUTPATIENT_CLINIC_OR_DEPARTMENT_OTHER): Payer: Self-pay | Admitting: Emergency Medicine

## 2013-09-28 DIAGNOSIS — L509 Urticaria, unspecified: Secondary | ICD-10-CM | POA: Insufficient documentation

## 2013-09-28 MED ORDER — PREDNISONE 50 MG PO TABS
60.0000 mg | ORAL_TABLET | Freq: Once | ORAL | Status: AC
  Administered 2013-09-28: 60 mg via ORAL
  Filled 2013-09-28 (×2): qty 1

## 2013-09-28 MED ORDER — DIPHENHYDRAMINE HCL 25 MG PO CAPS
50.0000 mg | ORAL_CAPSULE | Freq: Once | ORAL | Status: AC
  Administered 2013-09-28: 50 mg via ORAL
  Filled 2013-09-28: qty 2

## 2013-09-28 MED ORDER — FAMOTIDINE 20 MG PO TABS
20.0000 mg | ORAL_TABLET | Freq: Once | ORAL | Status: AC
  Administered 2013-09-28: 20 mg via ORAL
  Filled 2013-09-28: qty 1

## 2013-09-28 MED ORDER — PREDNISONE 20 MG PO TABS: 60.0000 mg | ORAL_TABLET | Freq: Every day | ORAL | Status: DC

## 2013-09-28 NOTE — ED Notes (Signed)
Pt c/o rash to entire body and feet itching x 2 days

## 2013-09-28 NOTE — ED Provider Notes (Signed)
CSN: [id number]     Arrival date & time 09/28/13  1849 History   First MD Initiated Contact with Patient 09/28/13 1902     Chief Complaint  Patient presents with  . Rash   (Consider location/radiation/quality/duration/timing/severity/associated sxs/prior Treatment) Patient is a 47 y.o. male presenting with rash. The history is provided by the patient. No language interpreter was used.  Rash Location:  Full body Quality: itchiness and redness   Severity:  Moderate Associated symptoms: no fever   Associated symptoms comment:  Generalized rash x 2 days. No known ingestions that have caused previous reactions. No shortness of breath or wheezing, no tongue or lip swelling. No difficulty swallowing.   Past Medical History  Diagnosis Date  . Environmental allergies    Past Surgical History  Procedure Laterality Date  . Appendectomy     Family History  Problem Relation Age of Onset  . Prostate cancer Father   . Hypertension Father   . Heart disease Paternal Grandfather   . Stroke Paternal Grandfather   . Hypertension Mother   . Diabetes Mother    History  Substance Use Topics  . Smoking status: Never Smoker   . Smokeless tobacco: Not on file  . Alcohol Use: No     Comment: rare    Review of Systems  Constitutional: Negative for fever and chills.  HENT: Negative.   Respiratory: Negative.   Skin: Positive for rash.       See HPI.  Neurological: Negative.     Allergies  Review of patient's allergies indicates no known allergies.  Home Medications  No current outpatient prescriptions on file. BP 128/76  Pulse 100  Temp(Src) 98.6 F (37 C) (Oral)  Resp 16  Ht 6\' 2"  (1.88 m)  Wt 220 lb (99.791 kg)  BMI 28.23 kg/m2  SpO2 98% Physical Exam  Constitutional: He appears well-developed and well-nourished.  HENT:  Head: Normocephalic.  Neck: Normal range of motion. Neck supple.  Cardiovascular: Normal rate and regular rhythm.   Pulmonary/Chest: Effort normal and  breath sounds normal.  Abdominal: Soft. Bowel sounds are normal. There is no tenderness. There is no rebound and no guarding.  Musculoskeletal: Normal range of motion.  Neurological: He is alert. No cranial nerve deficit.  Skin: Skin is warm and dry. Rash noted.  Generalized red rash in welts c/w hives.  Psychiatric: He has a normal mood and affect.    ED Course  Procedures (including critical care time) Labs Review Labs Reviewed - No data to display Imaging Review No results found.  EKG Interpretation   None       MDM  No diagnosis found. 1. Allergic reaction  Reaction limited to rash without SOB or oropharyngeal symptoms. Stable for discharge - Benadryl, prednisone, pepcid.    Arnoldo Hooker, PA-C 09/28/13 1931

## 2013-09-28 NOTE — ED Notes (Signed)
Has been taken over counter cold med, denies changes in every day products

## 2013-09-28 NOTE — ED Provider Notes (Signed)
Medical screening examination/treatment/procedure(s) were performed by non-physician practitioner and as supervising physician I was immediately available for consultation/collaboration.  EKG Interpretation   None         Walsie Smeltz B. Janina Trafton, MD 09/28/13 2324 

## 2013-09-28 NOTE — ED Notes (Signed)
Rash over body noticed this am, last pm feet were itching and this am hands itching  Did not notice body rash till this pm  Denies diff breathing or swallowing

## 2014-06-01 ENCOUNTER — Encounter: Payer: Self-pay | Admitting: Family Medicine

## 2014-06-01 ENCOUNTER — Ambulatory Visit (INDEPENDENT_AMBULATORY_CARE_PROVIDER_SITE_OTHER): Payer: BC Managed Care – PPO | Admitting: Family Medicine

## 2014-06-01 VITALS — BP 100/60 | HR 77 | Temp 97.9°F | Ht 74.75 in | Wt 211.0 lb

## 2014-06-01 DIAGNOSIS — Z23 Encounter for immunization: Secondary | ICD-10-CM

## 2014-06-01 DIAGNOSIS — Z0000 Encounter for general adult medical examination without abnormal findings: Secondary | ICD-10-CM

## 2014-06-01 LAB — POCT URINALYSIS DIPSTICK
BILIRUBIN UA: NEGATIVE
Blood, UA: NEGATIVE
Glucose, UA: NEGATIVE
Ketones, UA: NEGATIVE
LEUKOCYTES UA: NEGATIVE
NITRITE UA: NEGATIVE
Protein, UA: NEGATIVE
Spec Grav, UA: 1.025
UROBILINOGEN UA: 0.2
pH, UA: 6

## 2014-06-01 LAB — BASIC METABOLIC PANEL
BUN: 10 mg/dL (ref 6–23)
CALCIUM: 8.9 mg/dL (ref 8.4–10.5)
CO2: 29 mEq/L (ref 19–32)
Chloride: 106 mEq/L (ref 96–112)
Creatinine, Ser: 0.9 mg/dL (ref 0.4–1.5)
GFR: 103.1 mL/min (ref >60.00)
GLUCOSE: 88 mg/dL (ref 70–99)
Potassium: 3.8 mEq/L (ref 3.5–5.1)
Sodium: 140 mEq/L (ref 135–145)

## 2014-06-01 LAB — CBC WITH DIFFERENTIAL/PLATELET
BASOS ABS: 0 10*3/uL (ref 0.0–0.1)
Basophils Relative: 0.6 % (ref 0.0–3.0)
Eosinophils Absolute: 0.3 10*3/uL (ref 0.0–0.7)
Eosinophils Relative: 5.5 % — ABNORMAL HIGH (ref 0.0–5.0)
HCT: 38.5 % — ABNORMAL LOW (ref 39.0–52.0)
Hemoglobin: 13.5 g/dL (ref 13.0–17.0)
LYMPHS PCT: 20.3 % (ref 12.0–46.0)
Lymphs Abs: 1.3 10*3/uL (ref 0.7–4.0)
MCHC: 34.9 g/dL (ref 30.0–36.0)
MCV: 89.6 fl (ref 78.0–100.0)
MONOS PCT: 4.9 % (ref 3.0–12.0)
Monocytes Absolute: 0.3 10*3/uL (ref 0.1–1.0)
Neutro Abs: 4.3 10*3/uL (ref 1.4–7.7)
Neutrophils Relative %: 68.7 % (ref 43.0–77.0)
Platelets: 205 10*3/uL (ref 150.0–400.0)
RBC: 4.3 Mil/uL (ref 4.22–5.81)
RDW: 12.7 % (ref 11.5–15.5)
WBC: 6.2 10*3/uL (ref 4.0–10.5)

## 2014-06-01 LAB — TSH: TSH: 2.09 u[IU]/mL (ref 0.35–4.50)

## 2014-06-01 LAB — HEPATIC FUNCTION PANEL
ALBUMIN: 4 g/dL (ref 3.5–5.2)
ALK PHOS: 56 U/L (ref 39–117)
ALT: 21 U/L (ref 0–53)
AST: 10 U/L (ref 0–37)
Bilirubin, Direct: 0.1 mg/dL (ref 0.0–0.3)
Total Bilirubin: 0.6 mg/dL (ref 0.2–1.2)
Total Protein: 6.4 g/dL (ref 6.0–8.3)

## 2014-06-01 LAB — LIPID PANEL
CHOL/HDL RATIO: 4
Cholesterol: 153 mg/dL (ref 0–200)
HDL: 34.7 mg/dL — ABNORMAL LOW (ref >39.00)
LDL Cholesterol: 97 mg/dL (ref 0–99)
NONHDL: 118.3
Triglycerides: 109 mg/dL (ref 0.0–149.0)
VLDL: 21.8 mg/dL (ref 0.0–40.0)

## 2014-06-01 LAB — PSA: PSA: 1.33 ng/mL (ref 0.10–4.00)

## 2014-06-01 NOTE — Progress Notes (Signed)
Pre visit review using our clinic review tool, if applicable. No additional management support is needed unless otherwise documented below in the visit note. 

## 2014-06-01 NOTE — Progress Notes (Signed)
Subjective:    Patient ID: Devin Ramos, male    DOB: 06/11/1966, 48 y.o.   MRN: [id number]  HPI Pt here for cpe --no complaints.     Review of Systems  Constitutional: Negative.   HENT: Negative for congestion, ear pain, hearing loss, nosebleeds, postnasal drip, rhinorrhea, sinus pressure, sneezing and tinnitus.   Eyes: Negative for photophobia, discharge, itching and visual disturbance.  Respiratory: Negative.   Cardiovascular: Negative.   Gastrointestinal: Negative for abdominal pain, constipation, blood in stool, abdominal distention and anal bleeding.  Endocrine: Negative.   Genitourinary: Negative.   Musculoskeletal: Negative.   Skin: Negative.   Allergic/Immunologic: Negative.   Neurological: Negative for dizziness, weakness, light-headedness, numbness and headaches.  Psychiatric/Behavioral: Negative for suicidal ideas, confusion, sleep disturbance, dysphoric mood, decreased concentration and agitation. The patient is not nervous/anxious.    Past Medical History  Diagnosis Date  . Environmental allergies    History   Social History  . Marital Status: Married    Spouse Name: N/A    Number of Children: N/A  . Years of Education: N/A   Occupational History  . [employer]    Social History Main Topics  . Smoking status: Never Smoker   . Smokeless tobacco: Not on file  . Alcohol Use: No     Comment: rare  . Drug Use: No  . Sexual Activity: Yes    Partners: Female   Other Topics Concern  . Not on file   Social History Narrative   Exercise-- walking ,  Physical job   No current outpatient prescriptions on file.   No current facility-administered medications for this visit.   Family History  Problem Relation Age of Onset  . Prostate cancer Father   . Hypertension Father   . Heart disease Paternal Grandfather   . Stroke Paternal Grandfather   . Hypertension Mother   . Diabetes Mother        Objective:   Physical Exam  Constitutional: He is  oriented to person, place, and time. He appears well-developed and well-nourished. No distress.  HENT:  Head: Normocephalic and atraumatic.  Right Ear: External ear normal.  Left Ear: External ear normal.  Nose: Nose normal.  Mouth/Throat: Oropharynx is clear and moist. No oropharyngeal exudate.  Eyes: Conjunctivae and EOM are normal. Pupils are equal, round, and reactive to light. Right eye exhibits no discharge. Left eye exhibits no discharge.  Neck: Normal range of motion. Neck supple. No JVD present. No thyromegaly present.  Cardiovascular: Normal rate, regular rhythm and intact distal pulses.  Exam reveals no gallop and no friction rub.   No murmur heard. Pulmonary/Chest: Effort normal and breath sounds normal. No respiratory distress. He has no wheezes. He has no rales. He exhibits no tenderness.  Abdominal: Soft. Bowel sounds are normal. He exhibits no distension and no mass. There is no tenderness. There is no rebound and no guarding.  Genitourinary: Rectum normal, prostate normal and penis normal. Guaiac negative stool.  Musculoskeletal: Normal range of motion. He exhibits no edema and no tenderness.  Lymphadenopathy:    He has no cervical adenopathy.  Neurological: He is alert and oriented to person, place, and time. He displays normal reflexes. He exhibits normal muscle tone.  Skin: Skin is warm and dry. No rash noted. He is not diaphoretic. No erythema. No pallor.  Psychiatric: He has a normal mood and affect. His behavior is normal. Judgment and thought content normal.          Assessment &  Plan:  cpe-- ghm utd             Check labs       Tdap given Self testicular exam ho given to pt

## 2014-06-01 NOTE — Patient Instructions (Signed)
Preventive Care for Adults A healthy lifestyle and preventive care can promote health and wellness. Preventive health guidelines for men include the following key practices:  A routine yearly physical is a good way to check with your health care provider about your health and preventative screening. It is a chance to share any concerns and updates on your health and to receive a thorough exam.  Visit your dentist for a routine exam and preventative care every 6 months. Brush your teeth twice a day and floss once a day. Good oral hygiene prevents tooth decay and gum disease.  The frequency of eye exams is based on your age, health, family medical history, use of contact lenses, and other factors. Follow your health care provider's recommendations for frequency of eye exams.  Eat a healthy diet. Foods such as vegetables, fruits, whole grains, low-fat dairy products, and lean protein foods contain the nutrients you need without too many calories. Decrease your intake of foods high in solid fats, added sugars, and salt. Eat the right amount of calories for you.Get information about a proper diet from your health care provider, if necessary.  Regular physical exercise is one of the most important things you can do for your health. Most adults should get at least 150 minutes of moderate-intensity exercise (any activity that increases your heart rate and causes you to sweat) each week. In addition, most adults need muscle-strengthening exercises on 2 or more days a week.  Maintain a healthy weight. The body mass index (BMI) is a screening tool to identify possible weight problems. It provides an estimate of body fat based on height and weight. Your health care provider can find your BMI and can help you achieve or maintain a healthy weight.For adults 20 years and older:  A BMI below 18.5 is considered underweight.  A BMI of 18.5 to 24.9 is normal.  A BMI of 25 to 29.9 is considered overweight.  A BMI  of 30 and above is considered obese.  Maintain normal blood lipids and cholesterol levels by exercising and minimizing your intake of saturated fat. Eat a balanced diet with plenty of fruit and vegetables. Blood tests for lipids and cholesterol should begin at age 50 and be repeated every 5 years. If your lipid or cholesterol levels are high, you are over 50, or you are at high risk for heart disease, you may need your cholesterol levels checked more frequently.Ongoing high lipid and cholesterol levels should be treated with medicines if diet and exercise are not working.  If you smoke, find out from your health care provider how to quit. If you do not use tobacco, do not start.  Lung cancer screening is recommended for adults aged 73-80 years who are at high risk for developing lung cancer because of a history of smoking. A yearly low-dose CT scan of the lungs is recommended for people who have at least a 30-pack-year history of smoking and are a current smoker or have quit within the past 15 years. A pack year of smoking is smoking an average of 1 pack of cigarettes a day for 1 year (for example: 1 pack a day for 30 years or 2 packs a day for 15 years). Yearly screening should continue until the smoker has stopped smoking for at least 15 years. Yearly screening should be stopped for people who develop a health problem that would prevent them from having lung cancer treatment.  If you choose to drink alcohol, do not have more than  2 drinks per day. One drink is considered to be 12 ounces (355 mL) of beer, 5 ounces (148 mL) of wine, or 1.5 ounces (44 mL) of liquor.  Avoid use of street drugs. Do not share needles with anyone. Ask for help if you need support or instructions about stopping the use of drugs.  High blood pressure causes heart disease and increases the risk of stroke. Your blood pressure should be checked at least every 1-2 years. Ongoing high blood pressure should be treated with  medicines, if weight loss and exercise are not effective.  If you are 45-79 years old, ask your health care provider if you should take aspirin to prevent heart disease.  Diabetes screening involves taking a blood sample to check your fasting blood sugar level. This should be done once every 3 years, after age 45, if you are within normal weight and without risk factors for diabetes. Testing should be considered at a younger age or be carried out more frequently if you are overweight and have at least 1 risk factor for diabetes.  Colorectal cancer can be detected and often prevented. Most routine colorectal cancer screening begins at the age of 50 and continues through age 75. However, your health care provider may recommend screening at an earlier age if you have risk factors for colon cancer. On a yearly basis, your health care provider may provide home test kits to check for hidden blood in the stool. Use of a small camera at the end of a tube to directly examine the colon (sigmoidoscopy or colonoscopy) can detect the earliest forms of colorectal cancer. Talk to your health care provider about this at age 50, when routine screening begins. Direct exam of the colon should be repeated every 5-10 years through age 75, unless early forms of precancerous polyps or small growths are found.  People who are at an increased risk for hepatitis B should be screened for this virus. You are considered at high risk for hepatitis B if:  You were born in a country where hepatitis B occurs often. Talk with your health care provider about which countries are considered high risk.  Your parents were born in a high-risk country and you have not received a shot to protect against hepatitis B (hepatitis B vaccine).  You have HIV or AIDS.  You use needles to inject street drugs.  You live with, or have sex with, someone who has hepatitis B.  You are a man who has sex with other men (MSM).  You get hemodialysis  treatment.  You take certain medicines for conditions such as cancer, organ transplantation, and autoimmune conditions.  Hepatitis C blood testing is recommended for all people born from 1945 through 1965 and any individual with known risks for hepatitis C.  Practice safe sex. Use condoms and avoid high-risk sexual practices to reduce the spread of sexually transmitted infections (STIs). STIs include gonorrhea, chlamydia, syphilis, trichomonas, herpes, HPV, and human immunodeficiency virus (HIV). Herpes, HIV, and HPV are viral illnesses that have no cure. They can result in disability, cancer, and death.  If you are at risk of being infected with HIV, it is recommended that you take a prescription medicine daily to prevent HIV infection. This is called preexposure prophylaxis (PrEP). You are considered at risk if:  You are a man who has sex with other men (MSM) and have other risk factors.  You are a heterosexual man, are sexually active, and are at increased risk for HIV infection.    You take drugs by injection.  You are sexually active with a partner who has HIV.  Talk with your health care provider about whether you are at high risk of being infected with HIV. If you choose to begin PrEP, you should first be tested for HIV. You should then be tested every 3 months for as long as you are taking PrEP.  A one-time screening for abdominal aortic aneurysm (AAA) and surgical repair of large AAAs by ultrasound are recommended for men ages 32 to 67 years who are current or former smokers.  Healthy men should no longer receive prostate-specific antigen (PSA) blood tests as part of routine cancer screening. Talk with your health care provider about prostate cancer screening.  Testicular cancer screening is not recommended for adult males who have no symptoms. Screening includes self-exam, a health care provider exam, and other screening tests. Consult with your health care provider about any symptoms  you have or any concerns you have about testicular cancer.  Use sunscreen. Apply sunscreen liberally and repeatedly throughout the day. You should seek shade when your shadow is shorter than you. Protect yourself by wearing long sleeves, pants, a wide-brimmed hat, and sunglasses year round, whenever you are outdoors.  Once a month, do a whole-body skin exam, using a mirror to look at the skin on your back. Tell your health care provider about new moles, moles that have irregular borders, moles that are larger than a pencil eraser, or moles that have changed in shape or color.  Stay current with required vaccines (immunizations).  Influenza vaccine. All adults should be immunized every year.  Tetanus, diphtheria, and acellular pertussis (Td, Tdap) vaccine. An adult who has not previously received Tdap or who does not know his vaccine status should receive 1 dose of Tdap. This initial dose should be followed by tetanus and diphtheria toxoids (Td) booster doses every 10 years. Adults with an unknown or incomplete history of completing a 3-dose immunization series with Td-containing vaccines should begin or complete a primary immunization series including a Tdap dose. Adults should receive a Td booster every 10 years.  Varicella vaccine. An adult without evidence of immunity to varicella should receive 2 doses or a second dose if he has previously received 1 dose.  Human papillomavirus (HPV) vaccine. Males aged 68-21 years who have not received the vaccine previously should receive the 3-dose series. Males aged 22-26 years may be immunized. Immunization is recommended through the age of 6 years for any male who has sex with males and did not get any or all doses earlier. Immunization is recommended for any person with an immunocompromised condition through the age of 49 years if he did not get any or all doses earlier. During the 3-dose series, the second dose should be obtained 4-8 weeks after the first  dose. The third dose should be obtained 24 weeks after the first dose and 16 weeks after the second dose.  Zoster vaccine. One dose is recommended for adults aged 50 years or older unless certain conditions are present.  Measles, mumps, and rubella (MMR) vaccine. Adults born before [birth year] generally are considered immune to measles and mumps. Adults born in [birth year] or later should have 1 or more doses of MMR vaccine unless there is a contraindication to the vaccine or there is laboratory evidence of immunity to each of the three diseases. A routine second dose of MMR vaccine should be obtained at least 28 days after the first dose for students attending postsecondary  schools, health care workers, or international travelers. People who received inactivated measles vaccine or an unknown type of measles vaccine during 1963-1967 should receive 2 doses of MMR vaccine. People who received inactivated mumps vaccine or an unknown type of mumps vaccine before 1979 and are at high risk for mumps infection should consider immunization with 2 doses of MMR vaccine. Unvaccinated health care workers born before 1957 who lack laboratory evidence of measles, mumps, or rubella immunity or laboratory confirmation of disease should consider measles and mumps immunization with 2 doses of MMR vaccine or rubella immunization with 1 dose of MMR vaccine.  Pneumococcal 13-valent conjugate (PCV13) vaccine. When indicated, a person who is uncertain of his immunization history and has no record of immunization should receive the PCV13 vaccine. An adult aged 19 years or older who has certain medical conditions and has not been previously immunized should receive 1 dose of PCV13 vaccine. This PCV13 should be followed with a dose of pneumococcal polysaccharide (PPSV23) vaccine. The PPSV23 vaccine dose should be obtained at least 8 weeks after the dose of PCV13 vaccine. An adult aged 19 years or older who has certain medical conditions and  previously received 1 or more doses of PPSV23 vaccine should receive 1 dose of PCV13. The PCV13 vaccine dose should be obtained 1 or more years after the last PPSV23 vaccine dose.  Pneumococcal polysaccharide (PPSV23) vaccine. When PCV13 is also indicated, PCV13 should be obtained first. All adults aged 65 years and older should be immunized. An adult younger than age 65 years who has certain medical conditions should be immunized. Any person who resides in a nursing home or long-term care facility should be immunized. An adult smoker should be immunized. People with an immunocompromised condition and certain other conditions should receive both PCV13 and PPSV23 vaccines. People with human immunodeficiency virus (HIV) infection should be immunized as soon as possible after diagnosis. Immunization during chemotherapy or radiation therapy should be avoided. Routine use of PPSV23 vaccine is not recommended for American Indians, [place] Natives, or people younger than 65 years unless there are medical conditions that require PPSV23 vaccine. When indicated, people who have unknown immunization and have no record of immunization should receive PPSV23 vaccine. One-time revaccination 5 years after the first dose of PPSV23 is recommended for people aged 19-64 years who have chronic kidney failure, nephrotic syndrome, asplenia, or immunocompromised conditions. People who received 1-2 doses of PPSV23 before age 65 years should receive another dose of PPSV23 vaccine at age 65 years or later if at least 5 years have passed since the previous dose. Doses of PPSV23 are not needed for people immunized with PPSV23 at or after age 65 years.  Meningococcal vaccine. Adults with asplenia or persistent complement component deficiencies should receive 2 doses of quadrivalent meningococcal conjugate (MenACWY-D) vaccine. The doses should be obtained at least 2 months apart. Microbiologists working with certain meningococcal bacteria,  military recruits, people at risk during an outbreak, and people who travel to or live in countries with a high rate of meningitis should be immunized. A first-year college student up through age 21 years who is living in a residence hall should receive a dose if he did not receive a dose on or after his 16th birthday. Adults who have certain high-risk conditions should receive one or more doses of vaccine.  Hepatitis A vaccine. Adults who wish to be protected from this disease, have certain high-risk conditions, work with hepatitis A-infected animals, work in hepatitis A research labs, or   travel to or work in countries with a high rate of hepatitis A should be immunized. Adults who were previously unvaccinated and who anticipate close contact with an international adoptee during the first 60 days after arrival in the [place] from a country with a high rate of hepatitis A should be immunized.  Hepatitis B vaccine. Adults should be immunized if they wish to be protected from this disease, have certain high-risk conditions, may be exposed to blood or other infectious body fluids, are household contacts or sex partners of hepatitis B positive people, are clients or workers in certain care facilities, or travel to or work in countries with a high rate of hepatitis B.  Haemophilus influenzae type b (Hib) vaccine. A previously unvaccinated person with asplenia or sickle cell disease or having a scheduled splenectomy should receive 1 dose of Hib vaccine. Regardless of previous immunization, a recipient of a hematopoietic stem cell transplant should receive a 3-dose series 6-12 months after his successful transplant. Hib vaccine is not recommended for adults with HIV infection. Preventive Service / Frequency Ages 52 to 17  Blood pressure check.** / Every 1 to 2 years.  Lipid and cholesterol check.** / Every 5 years beginning at age 69.  Hepatitis C blood test.** / For any individual with known risks for  hepatitis C.  Skin self-exam. / Monthly.  Influenza vaccine. / Every year.  Tetanus, diphtheria, and acellular pertussis (Tdap, Td) vaccine.** / Consult your health care provider. 1 dose of Td every 10 years.  Varicella vaccine.** / Consult your health care provider.  HPV vaccine. / 3 doses over 6 months, if 72 or younger.  Measles, mumps, rubella (MMR) vaccine.** / You need at least 1 dose of MMR if you were born in 1957 or later. You may also need a second dose.  Pneumococcal 13-valent conjugate (PCV13) vaccine.** / Consult your health care provider.  Pneumococcal polysaccharide (PPSV23) vaccine.** / 1 to 2 doses if you smoke cigarettes or if you have certain conditions.  Meningococcal vaccine.** / 1 dose if you are age 35 to 60 years and a Market researcher living in a residence hall, or have one of several medical conditions. You may also need additional booster doses.  Hepatitis A vaccine.** / Consult your health care provider.  Hepatitis B vaccine.** / Consult your health care provider.  Haemophilus influenzae type b (Hib) vaccine.** / Consult your health care provider. Ages 35 to 8  Blood pressure check.** / Every 1 to 2 years.  Lipid and cholesterol check.** / Every 5 years beginning at age 57.  Lung cancer screening. / Every year if you are aged 44-80 years and have a 30-pack-year history of smoking and currently smoke or have quit within the past 15 years. Yearly screening is stopped once you have quit smoking for at least 15 years or develop a health problem that would prevent you from having lung cancer treatment.  Fecal occult blood test (FOBT) of stool. / Every year beginning at age 55 and continuing until age 73. You may not have to do this test if you get a colonoscopy every 10 years.  Flexible sigmoidoscopy** or colonoscopy.** / Every 5 years for a flexible sigmoidoscopy or every 10 years for a colonoscopy beginning at age 28 and continuing until age  1.  Hepatitis C blood test.** / For all people born from [birth year] through 1965 and any individual with known risks for hepatitis C.  Skin self-exam. / Monthly.  Influenza vaccine. / Every  year.  Tetanus, diphtheria, and acellular pertussis (Tdap/Td) vaccine.** / Consult your health care provider. 1 dose of Td every 10 years.  Varicella vaccine.** / Consult your health care provider.  Zoster vaccine.** / 1 dose for adults aged 53 years or older.  Measles, mumps, rubella (MMR) vaccine.** / You need at least 1 dose of MMR if you were born in 1957 or later. You may also need a second dose.  Pneumococcal 13-valent conjugate (PCV13) vaccine.** / Consult your health care provider.  Pneumococcal polysaccharide (PPSV23) vaccine.** / 1 to 2 doses if you smoke cigarettes or if you have certain conditions.  Meningococcal vaccine.** / Consult your health care provider.  Hepatitis A vaccine.** / Consult your health care provider.  Hepatitis B vaccine.** / Consult your health care provider.  Haemophilus influenzae type b (Hib) vaccine.** / Consult your health care provider. Ages 77 and over  Blood pressure check.** / Every 1 to 2 years.  Lipid and cholesterol check.**/ Every 5 years beginning at age 85.  Lung cancer screening. / Every year if you are aged 55-80 years and have a 30-pack-year history of smoking and currently smoke or have quit within the past 15 years. Yearly screening is stopped once you have quit smoking for at least 15 years or develop a health problem that would prevent you from having lung cancer treatment.  Fecal occult blood test (FOBT) of stool. / Every year beginning at age 33 and continuing until age 11. You may not have to do this test if you get a colonoscopy every 10 years.  Flexible sigmoidoscopy** or colonoscopy.** / Every 5 years for a flexible sigmoidoscopy or every 10 years for a colonoscopy beginning at age 28 and continuing until age 73.  Hepatitis C blood  test.** / For all people born from [birth year] through 1965 and any individual with known risks for hepatitis C.  Abdominal aortic aneurysm (AAA) screening.** / A one-time screening for ages 50 to 27 years who are current or former smokers.  Skin self-exam. / Monthly.  Influenza vaccine. / Every year.  Tetanus, diphtheria, and acellular pertussis (Tdap/Td) vaccine.** / 1 dose of Td every 10 years.  Varicella vaccine.** / Consult your health care provider.  Zoster vaccine.** / 1 dose for adults aged 34 years or older.  Pneumococcal 13-valent conjugate (PCV13) vaccine.** / Consult your health care provider.  Pneumococcal polysaccharide (PPSV23) vaccine.** / 1 dose for all adults aged 63 years and older.  Meningococcal vaccine.** / Consult your health care provider.  Hepatitis A vaccine.** / Consult your health care provider.  Hepatitis B vaccine.** / Consult your health care provider.  Haemophilus influenzae type b (Hib) vaccine.** / Consult your health care provider. **Family history and personal history of risk and conditions may change your health care provider's recommendations. Document Released: 12/16/2001 Document Revised: 10/25/2013 Document Reviewed: 03/17/2011 [facility] Patient Information 2015 [place], [place]. This information is not intended to replace advice given to you by your health care provider. Make sure you discuss any questions you have with your health care provider.

## 2014-06-01 NOTE — Addendum Note (Signed)
Addended by: Harl Bowie on: 06/01/2014 12:19 PM   Modules accepted: Orders

## 2014-11-01 ENCOUNTER — Encounter: Payer: Self-pay | Admitting: Medical

## 2014-11-01 ENCOUNTER — Ambulatory Visit (INDEPENDENT_AMBULATORY_CARE_PROVIDER_SITE_OTHER): Payer: BC Managed Care – PPO | Admitting: Medical

## 2014-11-01 VITALS — BP 135/65 | HR 104 | Temp 98.7°F | Ht 74.75 in | Wt 209.2 lb

## 2014-11-01 DIAGNOSIS — J0100 Acute maxillary sinusitis, unspecified: Secondary | ICD-10-CM

## 2014-11-01 MED ORDER — CEFDINIR 300 MG PO CAPS: 300.0000 mg | ORAL_CAPSULE | Freq: Two times a day (BID) | ORAL | Status: DC

## 2014-11-01 MED ORDER — FLUTICASONE PROPIONATE 50 MCG/ACT NA SUSP: 2.0000 | Freq: Every day | NASAL | Status: DC

## 2014-11-01 MED ORDER — BENZONATATE 100 MG PO CAPS: 100.0000 mg | ORAL_CAPSULE | Freq: Three times a day (TID) | ORAL | Status: DC | PRN

## 2014-11-01 NOTE — Patient Instructions (Addendum)
Your appear to have a sinus infection. I am prescribing cefdinir antibiotic for the infection. To help with the nasal congestion I prescribed  flonase nasal steroid. For your associated cough, I prescribed cough medicine benzonatate.  Rest, hydrate, tylenol for fever.  Follow up in 7 days or as needed.

## 2014-11-01 NOTE — Progress Notes (Signed)
Subjective:    Patient ID: Devin Ramos, male    DOB: [birth year], 48 y.o.   MRN: [id number]  HPI  Pt in today reporting  cough, nasal congestion and runny nose for 3  Days. Some maxillary sinus pressure.  Pt states coughing severe. Hacking at night and productive. Transient lower rib pain when he coughs.   Associated symptoms( below yes or no)  Fever-no Chills-yes, maybe sweating Chest congestion-yes Sneezing- rare occasional ching eyes-no Sore throat- mild  Post-nasal drainage-yes Wheezing-no Purulent  Nasal drainage-yes Fatigue-mild  Nonsmoker  Pt has allergic rhinitis. All year round.    Past Medical History  Diagnosis Date  . Environmental allergies     History   Social History  . Marital Status: Married    Spouse Name: N/A    Number of Children: N/A  . Years of Education: N/A   Occupational History  . [employer]    Social History Main Topics  . Smoking status: Never Smoker   . Smokeless tobacco: Not on file  . Alcohol Use: No     Comment: rare  . Drug Use: No  . Sexual Activity:    Partners: Female   Other Topics Concern  . Not on file   Social History Narrative   Exercise-- walking ,  Physical job    Past Surgical History  Procedure Laterality Date  . Appendectomy      Family History  Problem Relation Age of Onset  . Prostate cancer Father   . Hypertension Father   . Heart disease Paternal Grandfather   . Stroke Paternal Grandfather   . Hypertension Mother   . Diabetes Mother     No Known Allergies  No current outpatient prescriptions on file prior to visit.   No current facility-administered medications on file prior to visit.    BP 135/65 mmHg  Pulse 104  Temp(Src) 98.7 F (37.1 C) (Oral)  Ht 6' 2.75" (1.899 m)  Wt 209 lb 3.2 oz (94.892 kg)  BMI 26.31 kg/m2  SpO2 96%       Review of Systems  Constitutional: Positive for chills and fatigue. Negative for fever.  HENT: Positive for congestion, postnasal  drip, rhinorrhea, sinus pressure and sore throat.   Respiratory: Positive for cough. Negative for chest tightness, shortness of breath and wheezing.        Describes transient sharp one second pain lower rib regions both sides when he coughs. He states this started a couple days ago during severe cough episodes.  Cardiovascular: Negative for chest pain and palpitations.  Musculoskeletal: Negative for back pain and neck pain.  Neurological: Negative for dizziness and headaches.  Hematological: Negative for adenopathy. Does not bruise/bleed easily.       Objective:   Physical Exam   General  Mental Status - Alert. General Appearance - Well groomed. Not in acute distress.  Skin Rashes- No Rashes.  HEENT Head- Normal. Ear Auditory Canal - Left- Normal. Right - Normal.Tympanic Membrane- Left- moderate. Right- Normal. Eye Sclera/Conjunctiva- Left- Normal. Right- Normal. Nose & Sinuses Nasal Mucosa- Left-  Boggy and Congested. Right-  Boggy and  Congested.Bilateral maxillary and frontal sinus pressure. Mouth & Throat Lips: Upper Lip- Normal: no dryness, cracking, pallor, cyanosis, or vesicular eruption. Lower Lip-Normal: no dryness, cracking, pallor, cyanosis or vesicular eruption. Buccal Mucosa- Bilateral- No Aphthous ulcers. Oropharynx- No Discharge or Erythema. Tonsils: Characteristics- Bilateral- No Erythema or Congestion. Size/Enlargement- Bilateral- No enlargement. Discharge- bilateral-None.  Neck Neck- Supple. No Masses.   Chest and  Lung Exam Auscultation: Breath Sounds:-Clear even and unlabored.  Cardiovascular Auscultation:Rythm- Regular, rate and rhythm. Murmurs & Other Heart Sounds:Ausculatation of the heart reveal- No Murmurs.  Lymphatic Head & Neck General Head & Neck Lymphatics: Bilateral: Description- No Localized lymphadenopathy.        Assessment & Plan:

## 2014-11-01 NOTE — Assessment & Plan Note (Signed)
Your appear to have a sinus infection. I am prescribing cefdinir antibiotic for the infection. To help with the nasal congestion I prescribed  flonase nasal steroid. For your associated cough, I prescribed cough medicine benzonatate.  Rest, hydrate, tylenol for fever.   Note with his severe dry cough episodes would watch for any wheezing. If such were to recur then would call in an albuterol and steroid inhaler. However today on exam his lungs were clear.

## 2014-11-01 NOTE — Progress Notes (Signed)
Pre visit review using our clinic review tool, if applicable. No additional management support is needed unless otherwise documented below in the visit note. 

## 2015-01-25 ENCOUNTER — Encounter: Payer: Self-pay | Admitting: Adult Health

## 2015-01-25 ENCOUNTER — Ambulatory Visit (INDEPENDENT_AMBULATORY_CARE_PROVIDER_SITE_OTHER): Payer: BLUE CROSS/BLUE SHIELD | Admitting: Adult Health

## 2015-01-25 ENCOUNTER — Telehealth: Payer: Self-pay | Admitting: Adult Health

## 2015-01-25 VITALS — BP 120/80 | HR 94 | Temp 98.3°F | Resp 16 | Ht 74.75 in | Wt 209.8 lb

## 2015-01-25 DIAGNOSIS — J0110 Acute frontal sinusitis, unspecified: Secondary | ICD-10-CM

## 2015-01-25 DIAGNOSIS — J020 Streptococcal pharyngitis: Secondary | ICD-10-CM

## 2015-01-25 MED ORDER — AMOXICILLIN-POT CLAVULANATE 875-125 MG PO TABS: 1.0000 | ORAL_TABLET | Freq: Two times a day (BID) | ORAL | Status: DC

## 2015-01-25 MED ORDER — AMOXICILLIN 500 MG PO CAPS: 500.0000 mg | ORAL_CAPSULE | Freq: Three times a day (TID) | ORAL | Status: DC

## 2015-01-25 NOTE — Telephone Encounter (Signed)
Changed antibiotic from Amoxicillin 500mg  TID x 10 days to Augmentin BID x 10 days for sinus coverage. Attempted to call patient on his cell phone there was no answer and his voice mail is not set up so I could not leave message.

## 2015-01-25 NOTE — Patient Instructions (Addendum)
The rapid strep test came back positive. I have sent a prescription of Amoxicillin to your pharmacy. Take this medication three times a day for ten days and make sure you finish the antibiotics even when you start to feel better. You can use OTC Delsym for your cough. I would stop Claritin and try Zyrtec for your sinus drainage. You can also use a Netty Pot to help clear your sinus congestion. If your symptoms do not get better in three days or they worsen, please follow up.     Strep Throat Strep throat is an infection of the throat caused by a bacteria named Streptococcus pyogenes. Your health care provider may call the infection streptococcal "tonsillitis" or "pharyngitis" depending on whether there are signs of inflammation in the tonsils or back of the throat. Strep throat is most common in children aged 5-15 years during the cold months of the year, but it can occur in people of any age during any season. This infection is spread from person to person (contagious) through coughing, sneezing, or other close contact. SIGNS AND SYMPTOMS   Fever or chills.  Painful, swollen, red tonsils or throat.  Pain or difficulty when swallowing.  White or yellow spots on the tonsils or throat.  Swollen, tender lymph nodes or "glands" of the neck or under the jaw.  Red rash all over the body (rare). DIAGNOSIS  Many different infections can cause the same symptoms. A test must be done to confirm the diagnosis so the right treatment can be given. A "rapid strep test" can help your health care provider make the diagnosis in a few minutes. If this test is not available, a light swab of the infected area can be used for a throat culture test. If a throat culture test is done, results are usually available in a day or two. TREATMENT  Strep throat is treated with antibiotic medicine. HOME CARE INSTRUCTIONS   Gargle with 1 tsp of salt in 1 cup of warm water, 3-4 times per day or as needed for comfort.  Family  members who also have a sore throat or fever should be tested for strep throat and treated with antibiotics if they have the strep infection.  Make sure everyone in your household washes their hands well.  Do not share food, drinking cups, or personal items that could cause the infection to spread to others.  You may need to eat a soft food diet until your sore throat gets better.  Drink enough water and fluids to keep your urine clear or pale yellow. This will help prevent dehydration.  Get plenty of rest.  Stay home from school, day care, or work until you have been on antibiotics for 24 hours.  Take medicines only as directed by your health care provider.  Take your antibiotic medicine as directed by your health care provider. Finish it even if you start to feel better. SEEK MEDICAL CARE IF:   The glands in your neck continue to enlarge.  You develop a rash, cough, or earache.  You cough up green, yellow-brown, or bloody sputum.  You have pain or discomfort not controlled by medicines.  Your problems seem to be getting worse rather than better.  You have a fever. SEEK IMMEDIATE MEDICAL CARE IF:   You develop any new symptoms such as vomiting, severe headache, stiff or painful neck, chest pain, shortness of breath, or trouble swallowing.  You develop severe throat pain, drooling, or changes in your voice.  You develop  swelling of the neck, or the skin on the neck becomes red and tender.  You develop signs of dehydration, such as fatigue, dry mouth, and decreased urination.  You become increasingly sleepy, or you cannot wake up completely. MAKE SURE YOU:  Understand these instructions.  Will watch your condition.  Will get help right away if you are not doing well or get worse. Document Released: 10/17/2000 Document Revised: 03/06/2014 Document Reviewed: 12/19/2010 [facility] Patient Information 2015 [place], [place]. This information is not intended to replace advice  given to you by your health care provider. Make sure you discuss any questions you have with your health care provider.

## 2015-01-25 NOTE — Addendum Note (Signed)
Addended by: Apolinar Junes on: 01/25/2015 11:32 AM   Modules accepted: Orders

## 2015-01-25 NOTE — Progress Notes (Signed)
Subjective:    Patient ID: Devin Ramos, male    DOB: 01-02-1966, 49 y.o.   MRN: [id number]  HPI  Patient presents to the office for cough and PND for two days. Patient endorses dry heaving from the PND. Also endorses mild throat pain. Denies fevers, vomiting, nausea and vomiting. Denies trouble swallowin.    Review of Systems  Constitutional: Positive for chills and appetite change. Negative for fever and activity change.       No solid foods in the last 24 hours.   HENT: Positive for congestion, facial swelling, postnasal drip, rhinorrhea, sinus pressure and sore throat. Negative for trouble swallowing.   Eyes: Negative for pain.  Respiratory: Positive for cough. Negative for choking, chest tightness and shortness of breath.   Gastrointestinal: Negative for nausea, diarrhea and constipation.  Musculoskeletal: Positive for myalgias.  Neurological: Positive for headaches.   Past Medical History  Diagnosis Date  . Environmental allergies     History   Social History  . Marital Status: Married    Spouse Name: N/A  . Number of Children: N/A  . Years of Education: N/A   Occupational History  . [employer]    Social History Main Topics  . Smoking status: Never Smoker   . Smokeless tobacco: Not on file  . Alcohol Use: No     Comment: rare  . Drug Use: No  . Sexual Activity:    Partners: Female   Other Topics Concern  . Not on file   Social History Narrative   Exercise-- walking ,  Physical job    Past Surgical History  Procedure Laterality Date  . Appendectomy      Family History  Problem Relation Age of Onset  . Prostate cancer Father   . Hypertension Father   . Heart disease Paternal Grandfather   . Stroke Paternal Grandfather   . Hypertension Mother   . Diabetes Mother     No Known Allergies  Current Outpatient Prescriptions on File Prior to Visit  Medication Sig Dispense Refill  . fluticasone (FLONASE) 50 MCG/ACT nasal spray Place 2 sprays into  both nostrils daily. 16 g 1   No current facility-administered medications on file prior to visit.    BP 120/80 mmHg  Pulse 94  Temp(Src) 98.3 F (36.8 C) (Oral)  Resp 16  Ht 6' 2.75" (1.899 m)  Wt 209 lb 12.8 oz (95.165 kg)  BMI 26.39 kg/m2  SpO2 97%       Objective:   Physical Exam  Constitutional: He is oriented to person, place, and time. He appears well-developed and well-nourished. No distress.  HENT:  Head: Normocephalic and atraumatic.  Right Ear: External ear normal.  Left Ear: External ear normal.  Mild erythremia in throat. No exudate. Frontal sinus pressure with palpation.   Eyes: Right eye exhibits no discharge. Left eye exhibits no discharge.  Neck: Normal range of motion. Neck supple. No tracheal deviation present.  Mild cervical lymph node tenderness on right side.   Cardiovascular: Normal rate, regular rhythm and normal heart sounds.  Exam reveals no gallop and no friction rub.   No murmur heard. Pulmonary/Chest: Effort normal and breath sounds normal. No respiratory distress. He has no wheezes. He has no rales. He exhibits no tenderness.  Lymphadenopathy:    He has cervical adenopathy.  Neurological: He is alert and oriented to person, place, and time.  Skin: Skin is warm and dry. He is not diaphoretic.  Psychiatric: He has a normal mood  and affect. His behavior is normal.  Vitals reviewed.      Assessment & Plan:  Strep Throat - Prescribed Amoxicillin 500mg  TID for 10 days.  - Tylenol/Motrin for pain relief - Follow up in three days if symptoms do not improve  Acute Sinusitis with cough - Delsym for cough  - Switch from Claritin to Zyrtec and continue to use Flonase - Recommended the use of a Netty Pot - Follow up in three days if symptoms do not improve.

## 2015-01-25 NOTE — Progress Notes (Signed)
Pre visit review using our clinic review tool, if applicable. No additional management support is needed unless otherwise documented below in the visit note. 

## 2015-02-27 ENCOUNTER — Emergency Department (HOSPITAL_BASED_OUTPATIENT_CLINIC_OR_DEPARTMENT_OTHER)
Admission: EM | Admit: 2015-02-27 | Discharge: 2015-02-27 | Disposition: A | Discharge status: Home / Self Care | Payer: BLUE CROSS/BLUE SHIELD | Attending: Emergency Medicine | Admitting: Emergency Medicine

## 2015-02-27 ENCOUNTER — Encounter (HOSPITAL_BASED_OUTPATIENT_CLINIC_OR_DEPARTMENT_OTHER): Payer: Self-pay | Admitting: *Deleted

## 2015-02-27 DIAGNOSIS — R109 Unspecified abdominal pain: Secondary | ICD-10-CM | POA: Insufficient documentation

## 2015-02-27 DIAGNOSIS — M549 Dorsalgia, unspecified: Secondary | ICD-10-CM | POA: Diagnosis present

## 2015-02-27 DIAGNOSIS — Z7951 Long term (current) use of inhaled steroids: Secondary | ICD-10-CM | POA: Insufficient documentation

## 2015-02-27 DIAGNOSIS — Z792 Long term (current) use of antibiotics: Secondary | ICD-10-CM | POA: Diagnosis not present

## 2015-02-27 LAB — URINALYSIS, ROUTINE W REFLEX MICROSCOPIC
Bilirubin Urine: NEGATIVE
Glucose, UA: NEGATIVE mg/dL
Hgb urine dipstick: NEGATIVE
Ketones, ur: NEGATIVE mg/dL
LEUKOCYTES UA: NEGATIVE
Nitrite: NEGATIVE
PH: 6 (ref 5.0–8.0)
Protein, ur: NEGATIVE mg/dL
Specific Gravity, Urine: 1.021 (ref 1.005–1.030)
Urobilinogen, UA: 0.2 mg/dL (ref 0.0–1.0)

## 2015-02-27 MED ORDER — OXYCODONE-ACETAMINOPHEN 5-325 MG PO TABS
1.0000 | ORAL_TABLET | Freq: Four times a day (QID) | ORAL | Status: DC | PRN
Start: 2015-02-27 — End: 2015-03-01

## 2015-02-27 MED ORDER — OXYCODONE-ACETAMINOPHEN 5-325 MG PO TABS
1.0000 | ORAL_TABLET | Freq: Once | ORAL | Status: AC
  Administered 2015-02-27: 1 via ORAL
  Filled 2015-02-27: qty 1

## 2015-02-27 MED ORDER — CYCLOBENZAPRINE HCL 10 MG PO TABS: 10.0000 mg | ORAL_TABLET | Freq: Three times a day (TID) | ORAL | Status: DC | PRN

## 2015-02-27 NOTE — Discharge Instructions (Signed)
Flank Pain °Flank pain refers to pain that is located on the side of the body between the upper abdomen and the back. The pain may occur over a short period of time (acute) or may be long-term or reoccurring (chronic). It may be mild or severe. Flank pain can be caused by many things. °CAUSES  °Some of the more common causes of flank pain include: °· Muscle strains.   °· Muscle spasms.   °· A disease of your spine (vertebral disk disease).   °· A lung infection (pneumonia).   °· Fluid around your lungs (pulmonary edema).   °· A kidney infection.   °· Kidney stones.   °· A very painful skin rash caused by the chickenpox virus (shingles).   °· Gallbladder disease.   °HOME CARE INSTRUCTIONS  °Home care will depend on the cause of your pain. In general, °· Rest as directed by your caregiver. °· Drink enough fluids to keep your urine clear or pale yellow. °· Only take over-the-counter or prescription medicines as directed by your caregiver. Some medicines may help relieve the pain. °· Tell your caregiver about any changes in your pain. °· Follow up with your caregiver as directed. °SEEK IMMEDIATE MEDICAL CARE IF:  °· Your pain is not controlled with medicine.   °· You have new or worsening symptoms. °· Your pain increases.   °· You have abdominal pain.   °· You have shortness of breath.   °· You have persistent nausea or vomiting.   °· You have swelling in your abdomen.   °· You feel faint or pass out.   °· You have blood in your urine. °· You have a fever or persistent symptoms for more than 2-3 days. °· You have a fever and your symptoms suddenly get worse. °MAKE SURE YOU:  °· Understand these instructions. °· Will watch your condition. °· Will get help right away if you are not doing well or get worse. °Document Released: 12/11/2005 Document Revised: 07/14/2012 Document Reviewed: 06/03/2012 °[employer]® Patient Information ©2015 [employer]. This information is not intended to replace advice given to you by your  health care provider. Make sure you discuss any questions you have with your health care provider. ° °

## 2015-02-27 NOTE — ED Provider Notes (Signed)
CSN: [id number]     Arrival date & time 02/27/15  2440 History  This chart was scribed for Davonna Belling, MD by Irene Pap, ED Scribe. This patient was seen in room MH08/MH08 and patient care was started at 9:46 PM.     Chief Complaint  Patient presents with  . Back Pain   Patient is a 49 y.o. male presenting with back pain. The history is provided by the patient. No language interpreter was used.  Back Pain Associated symptoms: no dysuria and no fever   HPI Comments: Devin Ramos is a 49 y.o. male who presents to the Emergency Department complaining of lower left back pain onset 2 days ago. He reports that he has taken ibuprofen to no relief. He states that the pain has made it hard for him to sleep. He reports history of herniated disc but states that this pain feels more like a muscle strain. 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 He denies any urinary problems, nausea, vomiting, and fever. He denies any recent injury or activity change. He denies taking any pain medication on a a regular basis. He denies a history of kidney stones.   Past Medical History  Diagnosis Date  . Environmental allergies    Past Surgical History  Procedure Laterality Date  . Appendectomy     Family History  Problem Relation Age of Onset  . Prostate cancer Father   . Hypertension Father   . Heart disease Paternal Grandfather   . Stroke Paternal Grandfather   . Hypertension Mother   . Diabetes Mother    History  Substance Use Topics  . Smoking status: Never Smoker   . Smokeless tobacco: Not on file  . Alcohol Use: No     Comment: rare    Review of Systems  Constitutional: Negative for fever.  Gastrointestinal: Negative for nausea and vomiting.  Genitourinary: Negative for dysuria, urgency, frequency, hematuria, decreased urine volume and difficulty urinating.  Musculoskeletal: Positive for back pain.  All other systems  reviewed and are negative.  Allergies  Review of patient's allergies indicates no known allergies.  Home Medications   Prior to Admission medications   Medication Sig Start Date End Date Taking? Authorizing Provider  amoxicillin-clavulanate (AUGMENTIN) 875-125 MG per tablet Take 1 tablet by mouth 2 (two) times daily. 01/25/15   Dorothyann Peng, NP  cyclobenzaprine (FLEXERIL) 10 MG tablet Take 1 tablet (10 mg total) by mouth 3 (three) times daily as needed for muscle spasms. 02/27/15   Davonna Belling, MD  fluticasone (FLONASE) 50 MCG/ACT nasal spray Place 2 sprays into both nostrils daily. 11/01/14   Meriam Sprague Saguier, PA-C  oxyCODONE-acetaminophen (PERCOCET/ROXICET) 5-325 MG per tablet Take 1-2 tablets by mouth every 6 (six) hours as needed for severe pain. 02/27/15   Davonna Belling, MD   BP 116/76 mmHg  Pulse 70  Temp(Src) 98.3 F (36.8 C) (Oral)  Resp 20  Ht 6\' 2"  (1.88 m)  Wt 205 lb (92.987 kg)  BMI 26.31 kg/m2  SpO2 98%   Physical Exam  Constitutional: He appears well-developed and well-nourished. No distress.  HENT:  Head: Normocephalic and atraumatic.  Eyes: Conjunctivae are normal.  Neck: Neck supple.  Cardiovascular: Normal rate and regular rhythm.   Pulmonary/Chest: Effort normal and breath sounds normal. No respiratory distress.  Abdominal: Soft. He exhibits no distension and no mass. There is no tenderness.  Musculoskeletal: He exhibits no edema  or tenderness.  CVA area tenderness to left flank  Neurological: He is alert.  Skin: Skin is warm and dry.  Psychiatric: He has a normal mood and affect. His behavior is normal. Thought content normal.  Nursing note and vitals reviewed.   ED Course  Procedures (including critical care time)  9:49 PM-Discussed treatment plan which includes labs and pain medication with pt at bedside and pt agreed to plan.   Labs Review Labs Reviewed  URINALYSIS, ROUTINE W REFLEX MICROSCOPIC   Imaging Review No results found.   EKG  Interpretation None      MDM   Final diagnoses:  Left flank pain    Patient with back/flank pain. Likely musculoskeletal. Worse with movement. No dysuria or other urinary symptoms. No abdominal tenderness.    will symptomatically treat and discharge home. I personally performed the services described in this documentation, which was scribed in my presence. The recorded information has been reviewed and is accurate.    Davonna Belling, MD 02/27/15 2212

## 2015-02-27 NOTE — ED Notes (Signed)
Left flank pain for a few days. Thinks he is having muscle pain.

## 2015-02-27 NOTE — ED Notes (Signed)
Pain started Sunday, Hx herniated disk, pt states this is different.  Pain is lower Left back .

## 2015-02-27 NOTE — ED Notes (Signed)
Pt alert x4 respirations easy non labored. Ambulates without distress.

## 2015-03-01 ENCOUNTER — Emergency Department (HOSPITAL_BASED_OUTPATIENT_CLINIC_OR_DEPARTMENT_OTHER): Payer: BLUE CROSS/BLUE SHIELD

## 2015-03-01 ENCOUNTER — Emergency Department (HOSPITAL_BASED_OUTPATIENT_CLINIC_OR_DEPARTMENT_OTHER)
Admission: EM | Admit: 2015-03-01 | Discharge: 2015-03-02 | Disposition: A | Discharge status: Home / Self Care | Payer: BLUE CROSS/BLUE SHIELD | Attending: Emergency Medicine | Admitting: Emergency Medicine

## 2015-03-01 ENCOUNTER — Encounter (HOSPITAL_BASED_OUTPATIENT_CLINIC_OR_DEPARTMENT_OTHER): Payer: Self-pay | Admitting: *Deleted

## 2015-03-01 DIAGNOSIS — Z7951 Long term (current) use of inhaled steroids: Secondary | ICD-10-CM | POA: Insufficient documentation

## 2015-03-01 DIAGNOSIS — R52 Pain, unspecified: Secondary | ICD-10-CM

## 2015-03-01 DIAGNOSIS — R110 Nausea: Secondary | ICD-10-CM | POA: Insufficient documentation

## 2015-03-01 DIAGNOSIS — Z9049 Acquired absence of other specified parts of digestive tract: Secondary | ICD-10-CM | POA: Insufficient documentation

## 2015-03-01 DIAGNOSIS — R109 Unspecified abdominal pain: Secondary | ICD-10-CM | POA: Diagnosis not present

## 2015-03-01 DIAGNOSIS — M25552 Pain in left hip: Secondary | ICD-10-CM | POA: Insufficient documentation

## 2015-03-01 DIAGNOSIS — Z792 Long term (current) use of antibiotics: Secondary | ICD-10-CM | POA: Insufficient documentation

## 2015-03-01 DIAGNOSIS — M549 Dorsalgia, unspecified: Secondary | ICD-10-CM | POA: Insufficient documentation

## 2015-03-01 LAB — CBC WITH DIFFERENTIAL/PLATELET
Basophils Absolute: 0.1 10*3/uL (ref 0.0–0.1)
Basophils Relative: 1 % (ref 0–1)
EOS ABS: 0.2 10*3/uL (ref 0.0–0.7)
EOS PCT: 2 % (ref 0–5)
HCT: 37.6 % — ABNORMAL LOW (ref 39.0–52.0)
Hemoglobin: 13.2 g/dL (ref 13.0–17.0)
LYMPHS PCT: 13 % (ref 12–46)
Lymphs Abs: 1.2 10*3/uL (ref 0.7–4.0)
MCH: 30.5 pg (ref 26.0–34.0)
MCHC: 35.1 g/dL (ref 30.0–36.0)
MCV: 86.8 fL (ref 78.0–100.0)
MONO ABS: 0.5 10*3/uL (ref 0.1–1.0)
Monocytes Relative: 5 % (ref 3–12)
Neutro Abs: 7.8 10*3/uL — ABNORMAL HIGH (ref 1.7–7.7)
Neutrophils Relative %: 79 % — ABNORMAL HIGH (ref 43–77)
Platelets: 190 10*3/uL (ref 150–400)
RBC: 4.33 MIL/uL (ref 4.22–5.81)
RDW: 12.7 % (ref 11.5–15.5)
WBC: 9.8 10*3/uL (ref 4.0–10.5)

## 2015-03-01 LAB — COMPREHENSIVE METABOLIC PANEL
ALT: 17 U/L (ref 0–53)
AST: 9 U/L (ref 0–37)
Albumin: 4.2 g/dL (ref 3.5–5.2)
Alkaline Phosphatase: 65 U/L (ref 39–117)
Anion gap: 4 — ABNORMAL LOW (ref 5–15)
BUN: 12 mg/dL (ref 6–23)
CHLORIDE: 106 mmol/L (ref 96–112)
CO2: 30 mmol/L (ref 19–32)
CREATININE: 1.11 mg/dL (ref 0.50–1.35)
Calcium: 8.6 mg/dL (ref 8.4–10.5)
GFR calc non Af Amer: 78 mL/min — ABNORMAL LOW (ref >90)
Glucose, Bld: 98 mg/dL (ref 70–99)
POTASSIUM: 3.9 mmol/L (ref 3.5–5.1)
Sodium: 140 mmol/L (ref 135–145)
Total Bilirubin: 0.4 mg/dL (ref 0.3–1.2)
Total Protein: 7 g/dL (ref 6.0–8.3)

## 2015-03-01 LAB — URINALYSIS, ROUTINE W REFLEX MICROSCOPIC
BILIRUBIN URINE: NEGATIVE
GLUCOSE, UA: NEGATIVE mg/dL
Hgb urine dipstick: NEGATIVE
Ketones, ur: NEGATIVE mg/dL
LEUKOCYTES UA: NEGATIVE
NITRITE: NEGATIVE
PH: 6 (ref 5.0–8.0)
Protein, ur: NEGATIVE mg/dL
SPECIFIC GRAVITY, URINE: 1.008 (ref 1.005–1.030)
Urobilinogen, UA: 0.2 mg/dL (ref 0.0–1.0)

## 2015-03-01 LAB — I-STAT CG4 LACTIC ACID, ED: Lactic Acid, Venous: 0.46 mmol/L — ABNORMAL LOW (ref 0.5–2.0)

## 2015-03-01 MED ORDER — HYDROMORPHONE HCL 1 MG/ML IJ SOLN
1.0000 mg | Freq: Once | INTRAMUSCULAR | Status: AC
  Administered 2015-03-01: 1 mg via INTRAVENOUS
  Filled 2015-03-01: qty 1

## 2015-03-01 MED ORDER — KETOROLAC TROMETHAMINE 30 MG/ML IJ SOLN
15.0000 mg | Freq: Once | INTRAMUSCULAR | Status: AC
  Administered 2015-03-01: 15 mg via INTRAVENOUS
  Filled 2015-03-01: qty 1

## 2015-03-01 MED ORDER — ONDANSETRON HCL 4 MG/2ML IJ SOLN
4.0000 mg | Freq: Once | INTRAMUSCULAR | Status: AC
  Administered 2015-03-01: 4 mg via INTRAVENOUS
  Filled 2015-03-01: qty 2

## 2015-03-01 MED ORDER — HYDROMORPHONE HCL 2 MG PO TABS: 2.0000 mg | ORAL_TABLET | ORAL | Status: DC | PRN

## 2015-03-01 NOTE — ED Notes (Signed)
Pt alert, NAD, calm, interactive, back from CT, c/o back pain, denies other sx. Denies h/o kidney stones.

## 2015-03-01 NOTE — ED Provider Notes (Signed)
CSN: [id number]     Arrival date & time 03/01/15  1920 History  This chart was scribed for Leonard Schwartz, MD by Irene Pap, ED Scribe. This patient was seen in room MH05/MH05 and patient care was started at 8:01 PM.   Chief Complaint  Patient presents with  . Hip Pain   Patient is a 49 y.o. male presenting with hip pain. The history is provided by the patient. No language interpreter was used.  Hip Pain   HPI Comments: Devin Ramos is a 49 y.o. male who presents to the Emergency Department complaining of left hip pain onset 2 days ago. He states that he was seen in the ED two days ago for back pain and was given Flexeril and Oxycodone, but reports that this pain has gotten worse. He states that he was told that the pain was muscle related and not a kidney stone because his urianalysis came back negative. He states that it feels like "internal pain" that radiates from the left groin to his back. He states that the pain is constant, but can sometimes worsen. He states that nothing in his activities makes the pain better or worse. He states that he went to get food when he felt intense pain and nausea and felt like he was going to fall over. He rates the pain currently 9/10, but states that it can get to a 10/10. He reports a history of herniated disc, but states that this pain feels different. He denies a history of kidney stones.   Past Medical History  Diagnosis Date  . Environmental allergies    Past Surgical History  Procedure Laterality Date  . Appendectomy     Family History  Problem Relation Age of Onset  . Prostate cancer Father   . Hypertension Father   . Heart disease Paternal Grandfather   . Stroke Paternal Grandfather   . Hypertension Mother   . Diabetes Mother    History  Substance Use Topics  . Smoking status: Never Smoker   . Smokeless tobacco: Not on file  . Alcohol Use: No     Comment: rare    Review of Systems  Gastrointestinal: Positive for nausea.   Genitourinary: Positive for flank pain.  Musculoskeletal: Positive for back pain.  All other systems reviewed and are negative.  Allergies  Review of patient's allergies indicates no known allergies.  Home Medications   Prior to Admission medications   Medication Sig Start Date End Date Taking? Authorizing Provider  amoxicillin-clavulanate (AUGMENTIN) 875-125 MG per tablet Take 1 tablet by mouth 2 (two) times daily. 01/25/15   Dorothyann Peng, NP  cyclobenzaprine (FLEXERIL) 10 MG tablet Take 1 tablet (10 mg total) by mouth 3 (three) times daily as needed for muscle spasms. 02/27/15   Davonna Belling, MD  fluticasone (FLONASE) 50 MCG/ACT nasal spray Place 2 sprays into both nostrils daily. 11/01/14   Meriam Sprague Saguier, PA-C  HYDROmorphone (DILAUDID) 2 MG tablet Take 1 tablet (2 mg total) by mouth every 4 (four) hours as needed for severe pain. 03/01/15   Leonard Schwartz, MD   BP 143/85 mmHg  Pulse 98  Temp(Src) 98.7 F (37.1 C) (Oral)  Resp 22  Ht 6\' 2"  (1.88 m)  Wt 205 lb (92.987 kg)  BMI 26.31 kg/m2  SpO2 99% Physical Exam  Constitutional: He is oriented to person, place, and time. He appears well-developed and well-nourished. No distress.  HENT:  Head: Normocephalic and atraumatic.  Eyes: Pupils are equal, round, and reactive  to light.  Neck: Normal range of motion.  Cardiovascular: Normal rate and intact distal pulses.   Pulmonary/Chest: No respiratory distress.  Abdominal: Normal appearance. He exhibits no distension. There is no tenderness. There is no rebound.    Pain is not reproducible with palpation or movement of the hip either internal or external or flexion.  Musculoskeletal: Normal range of motion.  Neurological: He is alert and oriented to person, place, and time. No cranial nerve deficit.  Skin: Skin is warm and dry. No rash noted.  Psychiatric: He has a normal mood and affect. His behavior is normal.  Nursing note and vitals reviewed.   ED Course  Procedures  (including critical care time)  Medications  ondansetron (ZOFRAN) injection 4 mg (4 mg Intravenous Given 03/01/15 2031)  ketorolac (TORADOL) 30 MG/ML injection 15 mg (15 mg Intravenous Given 03/01/15 2032)  HYDROmorphone (DILAUDID) injection 1 mg (1 mg Intravenous Given 03/01/15 2034)    DIAGNOSTIC STUDIES: Oxygen Saturation is 99% on room air, normal by my interpretation.    COORDINATION OF CARE: 8:09 PM-Discussed treatment plan which includes labs, IV fluids, CT scan with pt at bedside and pt agreed to plan.   Labs Review Labs Reviewed  CBC WITH DIFFERENTIAL/PLATELET - Abnormal; Notable for the following:    HCT 37.6 (*)    Neutrophils Relative % 79 (*)    Neutro Abs 7.8 (*)    All other components within normal limits  COMPREHENSIVE METABOLIC PANEL - Abnormal; Notable for the following:    GFR calc non Af Amer 78 (*)    Anion gap 4 (*)    All other components within normal limits  I-STAT CG4 LACTIC ACID, ED - Abnormal; Notable for the following:    Lactic Acid, Venous 0.46 (*)    All other components within normal limits  URINALYSIS, ROUTINE W REFLEX MICROSCOPIC    Imaging Review Ct Renal Stone Study  03/01/2015   CLINICAL DATA:  Left hip pain for 2 days. No known injury. Initial encounter.  EXAM: CT ABDOMEN AND PELVIS WITHOUT CONTRAST  TECHNIQUE: Multidetector CT imaging of the abdomen and pelvis was performed following the standard protocol without IV contrast.  COMPARISON:  None.  FINDINGS: Lower chest: Lung bases are clear.  Hepatobiliary: No focal hepatic lesion. No biliary duct dilatation. Gallbladder is normal. Common bile duct is normal.  Pancreas: Pancreas is normal. No ductal dilatation. No pancreatic inflammation.  Spleen: Normal spleen  Adrenals/urinary tract: Adrenal glands and kidneys are normal. The ureters and bladder normal.  Stomach/Bowel: Stomach, small bowel, and cecum are normal. The colon and rectosigmoid colon are normal.  Vascular/Lymphatic: Abdominal aorta  normal caliber. There is no retroperitoneal periportal lymphadenopathy. There is mild haziness and stranding within the central mesentery inferior to the pancreas along the mesenteric vessels (image 28 through 56 of series 2). No pelvic lymphadenopathy.  Reproductive: Prostate gland is normal.  Musculoskeletal: No osseous abnormality.  Other: No free fluid.  IMPRESSION: 1. No acute findings in the abdomen or pelvis. No explanation for left hip pain. 2. Haziness of the central mesentery is nonspecific finding but can be associated with sclerosing mesenteritis.   Electronically Signed   By: Suzy Bouchard M.D.   On: 03/01/2015 20:43     EKG Interpretation None      MDM   Final diagnoses:  Pain    I personally performed the services described in this documentation, which was scribed in my presence. The recorded information has been reviewed and considered.  Leonard Schwartz, MD 03/01/15 2325

## 2015-03-01 NOTE — ED Notes (Signed)
Pt amb to triage with quick steady gait in nad. Pt reports left hip pain x 2 days, was seen here for back pain 2 days ago, "they said it was all muscle and not a kidney stone" now my left hip is killing me and the medications aren't working."

## 2015-03-01 NOTE — Discharge Instructions (Signed)
If no better by Saturday consider returning for an MR scan of your low back   Pain of Unknown Etiology (Pain Without a Known Cause) You have come to your caregiver because of pain. Pain can occur in any part of the body. Often there is not a definite cause. If your laboratory (blood or urine) work was normal and X-rays or other studies were normal, your caregiver may treat you without knowing the cause of the pain. An example of this is the headache. Most headaches are diagnosed by taking a history. This means your caregiver asks you questions about your headaches. Your caregiver determines a treatment based on your answers. Usually testing done for headaches is normal. Often testing is not done unless there is no response to medications. Regardless of where your pain is located today, you can be given medications to make you comfortable. If no physical cause of pain can be found, most cases of pain will gradually leave as suddenly as they came.  If you have a painful condition and no reason can be found for the pain, it is important that you follow up with your caregiver. If the pain becomes worse or does not go away, it may be necessary to repeat tests and look further for a possible cause.  Only take over-the-counter or prescription medicines for pain, discomfort, or fever as directed by your caregiver.  For the protection of your privacy, test results cannot be given over the phone. Make sure you receive the results of your test. Ask how these results are to be obtained if you have not been informed. It is your responsibility to obtain your test results.  You may continue all activities unless the activities cause more pain. When the pain lessens, it is important to gradually resume normal activities. Resume activities by beginning slowly and gradually increasing the intensity and duration of the activities or exercise. During periods of severe pain, bed rest may be helpful. Lie or sit in any position  that is comfortable.  Ice used for acute (sudden) conditions may be effective. Use a large plastic bag filled with ice and wrapped in a towel. This may provide pain relief.  See your caregiver for continued problems. Your caregiver can help or refer you for exercises or physical therapy if necessary. If you were given medications for your condition, do not drive, operate machinery or power tools, or sign legal documents for 24 hours. Do not drink alcohol, take sleeping pills, or take other medications that may interfere with treatment. See your caregiver immediately if you have pain that is becoming worse and not relieved by medications. Document Released: 07/15/2001 Document Revised: 08/10/2013 Document Reviewed: 10/20/2005 [facility] Patient Information 2015 [place], [place]. This information is not intended to replace advice given to you by your health care provider. Make sure you discuss any questions you have with your health care provider.

## 2015-03-05 ENCOUNTER — Encounter: Payer: Self-pay | Admitting: Family Medicine

## 2015-03-05 ENCOUNTER — Ambulatory Visit (INDEPENDENT_AMBULATORY_CARE_PROVIDER_SITE_OTHER): Payer: BLUE CROSS/BLUE SHIELD | Admitting: Family Medicine

## 2015-03-05 ENCOUNTER — Ambulatory Visit (HOSPITAL_BASED_OUTPATIENT_CLINIC_OR_DEPARTMENT_OTHER)
Admission: RE | Admit: 2015-03-05 | Discharge: 2015-03-05 | Disposition: A | Discharge status: Home / Self Care | Payer: BLUE CROSS/BLUE SHIELD | Source: Ambulatory Visit | Attending: Family Medicine | Admitting: Family Medicine

## 2015-03-05 VITALS — BP 122/82 | HR 84 | Temp 98.3°F | Wt 216.8 lb

## 2015-03-05 DIAGNOSIS — M25552 Pain in left hip: Secondary | ICD-10-CM

## 2015-03-05 DIAGNOSIS — R1030 Lower abdominal pain, unspecified: Secondary | ICD-10-CM | POA: Insufficient documentation

## 2015-03-05 DIAGNOSIS — M545 Low back pain: Secondary | ICD-10-CM | POA: Insufficient documentation

## 2015-03-05 DIAGNOSIS — M5137 Other intervertebral disc degeneration, lumbosacral region: Secondary | ICD-10-CM | POA: Diagnosis not present

## 2015-03-05 MED ORDER — CARISOPRODOL 350 MG PO TABS: 350.0000 mg | ORAL_TABLET | Freq: Four times a day (QID) | ORAL | Status: DC | PRN

## 2015-03-05 NOTE — Patient Instructions (Signed)
Back Injury Prevention Back injuries can be extremely painful and difficult to heal. After having one back injury, you are much more likely to experience another later on. It is important to learn how to avoid injuring or re-injuring your back. The following tips can help you to prevent a back injury. PHYSICAL FITNESS  Exercise regularly and try to develop good tone in your abdominal muscles. Your abdominal muscles provide a lot of the support needed by your back.  Do aerobic exercises (walking, jogging, biking, swimming) regularly.  Do exercises that increase balance and strength (tai chi, yoga) regularly. This can decrease your risk of falling and injuring your back.  Stretch before and after exercising.  Maintain a healthy weight. The more you weigh, the more stress is placed on your back. For every pound of weight, 10 times that amount of pressure is placed on the back. DIET  Talk to your caregiver about how much calcium and vitamin D you need per day. These nutrients help to prevent weakening of the bones (osteoporosis). Osteoporosis can cause broken (fractured) bones that lead to back pain.  Include good sources of calcium in your diet, such as dairy products, green, leafy vegetables, and products with calcium added (fortified).  Include good sources of vitamin D in your diet, such as milk and foods that are fortified with vitamin D.  Consider taking a nutritional supplement or a multivitamin if needed.  Stop smoking if you smoke. POSTURE  Sit and stand up straight. Avoid leaning forward when you sit or hunching over when you stand.  Choose chairs with good low back (lumbar) support.  If you work at a desk, sit close to your work so you do not need to lean over. Keep your chin tucked in. Keep your neck drawn back and elbows bent at a right angle. Your arms should look like the letter "L."  Sit high and close to the steering wheel when you drive. Add a lumbar support to your car  seat if needed.  Avoid sitting or standing in one position for too long. Take breaks to get up, stretch, and walk around at least once every hour. Take breaks if you are driving for long periods of time.  Sleep on your side with your knees slightly bent, or sleep on your back with a pillow under your knees. Do not sleep on your stomach. LIFTING, TWISTING, AND REACHING  Avoid heavy lifting, especially repetitive lifting. If you must do heavy lifting:  Stretch before lifting.  Work slowly.  Rest between lifts.  Use carts and dollies to move objects when possible.  Make several small trips instead of carrying 1 heavy load.  Ask for help when you need it.  Ask for help when moving big, awkward objects.  Follow these steps when lifting:  Stand with your feet shoulder-width apart.  Get as close to the object as you can. Do not try to pick up heavy objects that are far from your body.  Use handles or lifting straps if they are available.  Bend at your knees. Squat down, but keep your heels off the floor.  Keep your shoulders pulled back, your chin tucked in, and your back straight.  Lift the object slowly, tightening the muscles in your legs, abdomen, and buttocks. Keep the object as close to the center of your body as possible.  When you put a load down, use these same guidelines in reverse.  Do not:  Lift the object above your waist.    Twist at the waist while lifting or carrying a load. Move your feet if you need to turn, not your waist.  Bend over without bending at your knees.  Avoid reaching over your head, across a table, or for an object on a high surface. OTHER TIPS  Avoid wet floors and keep sidewalks clear of ice to prevent falls.  Do not sleep on a mattress that is too soft or too hard.  Keep items that are used frequently within easy reach.  Put heavier objects on shelves at waist level and lighter objects on lower or higher shelves.  Find ways to  decrease your stress, such as exercise, massage, or relaxation techniques. Stress can build up in your muscles. Tense muscles are more vulnerable to injury.  Seek treatment for depression or anxiety if needed. These conditions can increase your risk of developing back pain. SEEK MEDICAL CARE IF:  You injure your back.  You have questions about diet, exercise, or other ways to prevent back injuries. MAKE SURE YOU:  Understand these instructions.  Will watch your condition.  Will get help right away if you are not doing well or get worse. Document Released: 11/27/2004 Document Revised: 01/12/2012 Document Reviewed: 12/01/2011 [employer] Patient Information 2015 [employer]. This information is not intended to replace advice given to you by your health care provider. Make sure you discuss any questions you have with your health care provider.  

## 2015-03-05 NOTE — Progress Notes (Signed)
Patient ID: SAMANYU TINNELL, male    DOB: 1966/01/27  Age: 49 y.o. MRN: [id number]    Subjective:  Subjective HPI TAVI HOOGENDOORN presents for L hip pain x several weeks.  No known trauma.  Review of Systems  Constitutional: Negative.   HENT: Negative for congestion, ear pain, hearing loss, nosebleeds, postnasal drip, rhinorrhea, sinus pressure, sneezing and tinnitus.   Eyes: Negative for photophobia, discharge, itching and visual disturbance.  Respiratory: Negative.   Cardiovascular: Negative.   Gastrointestinal: Negative for abdominal pain, constipation, blood in stool, abdominal distention and anal bleeding.  Endocrine: Negative.   Genitourinary: Negative.   Musculoskeletal: Positive for myalgias and back pain.       L hip pain  Skin: Negative.   Allergic/Immunologic: Negative.   Neurological: Negative for dizziness, weakness, light-headedness, numbness and headaches.  Psychiatric/Behavioral: Negative for suicidal ideas, confusion, sleep disturbance, dysphoric mood, decreased concentration and agitation. The patient is not nervous/anxious.     History Past Medical History  Diagnosis Date  . Environmental allergies     He has past surgical history that includes Appendectomy.   His family history includes Diabetes in his mother; Heart disease in his paternal grandfather; Hypertension in his father and mother; Prostate cancer in his father; Stroke in his paternal grandfather.He reports that he has never smoked. He does not have any smokeless tobacco history on file. He reports that he does not drink alcohol or use illicit drugs.  No current outpatient prescriptions on file prior to visit.   No current facility-administered medications on file prior to visit.     Objective:  Objective Physical Exam  Constitutional: He is oriented to person, place, and time. He appears well-developed and well-nourished. No distress.  HENT:  Head: Normocephalic and atraumatic.  Right  Ear: External ear normal.  Left Ear: External ear normal.  Nose: Nose normal.  Mouth/Throat: Oropharynx is clear and moist. No oropharyngeal exudate.  Eyes: Conjunctivae and EOM are normal. Pupils are equal, round, and reactive to light. Right eye exhibits no discharge. Left eye exhibits no discharge.  Neck: Normal range of motion. Neck supple. No JVD present. No thyromegaly present.  Cardiovascular: Normal rate, regular rhythm and intact distal pulses.  Exam reveals no gallop and no friction rub.   No murmur heard. Pulmonary/Chest: Effort normal and breath sounds normal. No respiratory distress. He has no wheezes. He has no rales. He exhibits no tenderness.  Abdominal: Soft. Bowel sounds are normal. He exhibits no distension and no mass. There is no tenderness. There is no rebound and no guarding.  Genitourinary: Rectum normal, prostate normal and penis normal. Guaiac negative stool.  Musculoskeletal: Normal range of motion. He exhibits no edema.       Left hip: He exhibits tenderness.       Legs: Lymphadenopathy:    He has no cervical adenopathy.  Neurological: He is alert and oriented to person, place, and time. He displays normal reflexes. He exhibits normal muscle tone.  Skin: Skin is warm and dry. No rash noted. He is not diaphoretic. No erythema. No pallor.  Psychiatric: He has a normal mood and affect. His behavior is normal. Judgment and thought content normal.   BP 122/82 mmHg  Pulse 84  Temp(Src) 98.3 F (36.8 C) (Oral)  Wt 216 lb 12.8 oz (98.34 kg) Wt Readings from Last 3 Encounters:  03/05/15 216 lb 12.8 oz (98.34 kg)  03/01/15 205 lb (92.987 kg)  02/27/15 205 lb (92.987 kg)     Lab  Results  Component Value Date   WBC 9.8 03/01/2015   HGB 13.2 03/01/2015   HCT 37.6* 03/01/2015   PLT 190 03/01/2015   GLUCOSE 98 03/01/2015   CHOL 153 06/01/2014   TRIG 109.0 06/01/2014   HDL 34.70* 06/01/2014   LDLCALC 97 06/01/2014   ALT 17 03/01/2015   AST 9 03/01/2015   NA  140 03/01/2015   K 3.9 03/01/2015   CL 106 03/01/2015   CREATININE 1.11 03/01/2015   BUN 12 03/01/2015   CO2 30 03/01/2015   TSH 2.09 06/01/2014   PSA 1.33 06/01/2014    Dg Lumbar Spine Complete  03/05/2015   CLINICAL DATA:  Low back pain for years. Left groin pain for a week with no trauma.  EXAM: LUMBAR SPINE - COMPLETE 4+ VIEW  COMPARISON:  CT of the abdomen and pelvis of 03/01/2015  FINDINGS: Five lumbar type vertebral bodies. Sacroiliac joints are symmetric. Maintenance of vertebral body height and alignment. Degenerative disc disease at the lumbosacral junction is moderate.  IMPRESSION: Lumbosacral degenerative disc disease. No acute osseous abnormality.   Electronically Signed   By: Abigail Miyamoto M.D.   On: 03/05/2015 12:38   Dg Hip Unilat With Pelvis 2-3 Views Left  03/05/2015   CLINICAL DATA:  Low back pain.  Left groin pain for 1 week.  EXAM: LEFT HIP (WITH PELVIS) 2-3 VIEWS  COMPARISON:  None.  FINDINGS: Pelvic bony ring is intact. Normal and symmetric appearance of the sacroiliac joints. No gross abnormality to either hip. The left hip is located without fracture.  IMPRESSION: No acute bone abnormality to the pelvis or left hip.   Electronically Signed   By: Markus Daft M.D.   On: 03/05/2015 12:35     Assessment & Plan:  Plan I have discontinued Mr. Reznik's fluticasone, amoxicillin-clavulanate, cyclobenzaprine, and HYDROmorphone. I am also having him start on carisoprodol.  Meds ordered this encounter  Medications  . carisoprodol (SOMA) 350 MG tablet    Sig: Take 1 tablet (350 mg total) by mouth 4 (four) times daily as needed for muscle spasms.    Dispense:  30 tablet    Refill:  0    Problem List Items Addressed This Visit    None    Visit Diagnoses    Hip pain, acute, left    -  Primary    Relevant Medications    carisoprodol (SOMA) 350 MG tablet    Other Relevant Orders    DG HIP UNILAT WITH PELVIS 2-3 VIEWS LEFT (Completed)    DG Lumbar Spine Complete (Completed)         Follow-up: Return in about 2 weeks (around 03/19/2015), or if symptoms worsen or fail to improve.  Garnet Koyanagi, DO

## 2015-03-05 NOTE — Progress Notes (Signed)
Pre visit review using our clinic review tool, if applicable. No additional management support is needed unless otherwise documented below in the visit note. 

## 2015-03-16 ENCOUNTER — Ambulatory Visit (INDEPENDENT_AMBULATORY_CARE_PROVIDER_SITE_OTHER): Payer: BLUE CROSS/BLUE SHIELD | Admitting: Medical

## 2015-03-16 ENCOUNTER — Encounter: Payer: Self-pay | Admitting: Medical

## 2015-03-16 VITALS — BP 118/81 | HR 67 | Temp 98.1°F | Ht 74.7 in | Wt 209.2 lb

## 2015-03-16 DIAGNOSIS — M25552 Pain in left hip: Secondary | ICD-10-CM

## 2015-03-16 NOTE — Progress Notes (Signed)
   Subjective:    Patient ID: Devin Ramos, male    DOB: 12-Mar-1966, 49 y.o.   MRN: [id number]  HPI  Pt has been off for 2 wks straight. Pt had hip pains left side.  He had a note to return to work light duty but work told him he needed to come back without restrictions. About one week since lt hip pain resolved. Pt was on med for pain and muscle relaxant. No  meds for pain for one week. Needs return to work not no restrictions.  No back pain recently.   Review of Systems  Constitutional: Negative for fever, chills and fatigue.  Respiratory: Negative for cough, chest tightness, shortness of breath and wheezing.   Cardiovascular: Negative for chest pain and leg swelling.  Gastrointestinal: Negative.   Musculoskeletal:       No hip pain.    Past Medical History  Diagnosis Date  . Environmental allergies     History   Social History  . Marital Status: Married    Spouse Name: N/A  . Number of Children: N/A  . Years of Education: N/A   Occupational History  . [employer]    Social History Main Topics  . Smoking status: Never Smoker   . Smokeless tobacco: Not on file  . Alcohol Use: No     Comment: rare  . Drug Use: No  . Sexual Activity:    Partners: Female   Other Topics Concern  . Not on file   Social History Narrative   Exercise-- walking ,  Physical job    Past Surgical History  Procedure Laterality Date  . Appendectomy      Family History  Problem Relation Age of Onset  . Prostate cancer Father   . Hypertension Father   . Heart disease Paternal Grandfather   . Stroke Paternal Grandfather   . Hypertension Mother   . Diabetes Mother     No Known Allergies  Current Outpatient Prescriptions on File Prior to Visit  Medication Sig Dispense Refill  . carisoprodol (SOMA) 350 MG tablet Take 1 tablet (350 mg total) by mouth 4 (four) times daily as needed for muscle spasms. (Patient not taking: Reported on 03/16/2015) 30 tablet 0   No current  facility-administered medications on file prior to visit.    BP 118/81 mmHg  Pulse 67  Temp(Src) 98.1 F (36.7 C) (Oral)  Ht 6' 2.7" (1.897 m)  Wt 209 lb 3.2 oz (94.892 kg)  BMI 26.37 kg/m2  SpO2 99%       Objective:   Physical Exam  General- No acute distress. Pleasant patient. Neck- Full range of motion, no jvd Lungs- Clear, even and unlabored. Heart- regular rate and rhythm. Neurologic- CNII- XII grossly intact. Both hips- from with no pain, no pain on palpation.no crepitus.      Assessment & Plan:

## 2015-03-16 NOTE — Progress Notes (Signed)
Pre visit review using our clinic review tool, if applicable. No additional management support is needed unless otherwise documented below in the visit note. 

## 2015-03-16 NOTE — Assessment & Plan Note (Signed)
Resolved now. Return to work note.

## 2015-03-16 NOTE — Patient Instructions (Signed)
Hip pain resolved. Note to return on Mar 19, 2015. No restrictions. If on return to work pain flares/reoccurs then notify us and can recheck/re-eval.

## 2015-06-21 ENCOUNTER — Telehealth: Payer: Self-pay | Admitting: Family Medicine

## 2015-06-21 NOTE — Telephone Encounter (Signed)
pre visit letter mailed 06/07/15 °

## 2015-06-27 ENCOUNTER — Telehealth: Payer: Self-pay | Admitting: *Deleted

## 2015-06-27 NOTE — Telephone Encounter (Signed)
Unable to reach patient at time of Pre-Visit Call.  Patient has a voicemail box that has not been set up, unable to leave message.

## 2015-06-28 ENCOUNTER — Encounter: Payer: Self-pay | Admitting: Family Medicine

## 2015-06-28 ENCOUNTER — Ambulatory Visit (INDEPENDENT_AMBULATORY_CARE_PROVIDER_SITE_OTHER): Payer: BLUE CROSS/BLUE SHIELD | Admitting: Family Medicine

## 2015-06-28 VITALS — BP 127/80 | HR 79 | Temp 98.1°F | Ht 75.0 in | Wt 216.4 lb

## 2015-06-28 DIAGNOSIS — Z0000 Encounter for general adult medical examination without abnormal findings: Secondary | ICD-10-CM | POA: Insufficient documentation

## 2015-06-28 LAB — COMPREHENSIVE METABOLIC PANEL
ALBUMIN: 4.4 g/dL (ref 3.5–5.2)
ALT: 15 U/L (ref 0–53)
AST: 7 U/L (ref 0–37)
Alkaline Phosphatase: 66 U/L (ref 39–117)
BUN: 12 mg/dL (ref 6–23)
CHLORIDE: 104 meq/L (ref 96–112)
CO2: 30 mEq/L (ref 19–32)
Calcium: 8.9 mg/dL (ref 8.4–10.5)
Creatinine, Ser: 0.92 mg/dL (ref 0.40–1.50)
GFR: 93.67 mL/min (ref >60.00)
GLUCOSE: 94 mg/dL (ref 70–99)
Potassium: 3.8 mEq/L (ref 3.5–5.1)
Sodium: 141 mEq/L (ref 135–145)
Total Bilirubin: 0.5 mg/dL (ref 0.2–1.2)
Total Protein: 6.6 g/dL (ref 6.0–8.3)

## 2015-06-28 LAB — CBC WITH DIFFERENTIAL/PLATELET
BASOS ABS: 0.1 10*3/uL (ref 0.0–0.1)
Basophils Relative: 0.8 % (ref 0.0–3.0)
Eosinophils Absolute: 0.5 10*3/uL (ref 0.0–0.7)
Eosinophils Relative: 7.5 % — ABNORMAL HIGH (ref 0.0–5.0)
HCT: 38.8 % — ABNORMAL LOW (ref 39.0–52.0)
HEMOGLOBIN: 13.4 g/dL (ref 13.0–17.0)
Lymphocytes Relative: 23.9 % (ref 12.0–46.0)
Lymphs Abs: 1.5 10*3/uL (ref 0.7–4.0)
MCHC: 34.5 g/dL (ref 30.0–36.0)
MCV: 88.7 fl (ref 78.0–100.0)
MONO ABS: 0.3 10*3/uL (ref 0.1–1.0)
Monocytes Relative: 5.4 % (ref 3.0–12.0)
Neutro Abs: 3.8 10*3/uL (ref 1.4–7.7)
Neutrophils Relative %: 62.4 % (ref 43.0–77.0)
Platelets: 213 10*3/uL (ref 150.0–400.0)
RBC: 4.38 Mil/uL (ref 4.22–5.81)
RDW: 13.1 % (ref 11.5–15.5)
WBC: 6.2 10*3/uL (ref 4.0–10.5)

## 2015-06-28 LAB — PSA: PSA: 0.5 ng/mL (ref 0.10–4.00)

## 2015-06-28 LAB — LIPID PANEL
CHOLESTEROL: 158 mg/dL (ref 0–200)
HDL: 31.4 mg/dL — ABNORMAL LOW (ref >39.00)
LDL CALC: 102 mg/dL — AB (ref 0–99)
NONHDL: 126.42
Total CHOL/HDL Ratio: 5
Triglycerides: 123 mg/dL (ref 0.0–149.0)
VLDL: 24.6 mg/dL (ref 0.0–40.0)

## 2015-06-28 LAB — TSH: TSH: 1.46 u[IU]/mL (ref 0.35–4.50)

## 2015-06-28 NOTE — Assessment & Plan Note (Signed)
See AVS ghm utd Check labs rto 1 year or sooner prn

## 2015-06-28 NOTE — Patient Instructions (Signed)
Preventive Care for Adults A healthy lifestyle and preventive care can promote health and wellness. Preventive health guidelines for men include the following key practices:  A routine yearly physical is a good way to check with your health care provider about your health and preventative screening. It is a chance to share any concerns and updates on your health and to receive a thorough exam.  Visit your dentist for a routine exam and preventative care every 6 months. Brush your teeth twice a day and floss once a day. Good oral hygiene prevents tooth decay and gum disease.  The frequency of eye exams is based on your age, health, family medical history, use of contact lenses, and other factors. Follow your health care provider's recommendations for frequency of eye exams.  Eat a healthy diet. Foods such as vegetables, fruits, whole grains, low-fat dairy products, and lean protein foods contain the nutrients you need without too many calories. Decrease your intake of foods high in solid fats, added sugars, and salt. Eat the right amount of calories for you.Get information about a proper diet from your health care provider, if necessary.  Regular physical exercise is one of the most important things you can do for your health. Most adults should get at least 150 minutes of moderate-intensity exercise (any activity that increases your heart rate and causes you to sweat) each week. In addition, most adults need muscle-strengthening exercises on 2 or more days a week.  Maintain a healthy weight. The body mass index (BMI) is a screening tool to identify possible weight problems. It provides an estimate of body fat based on height and weight. Your health care provider can find your BMI and can help you achieve or maintain a healthy weight.For adults 20 years and older:  A BMI below 18.5 is considered underweight.  A BMI of 18.5 to 24.9 is normal.  A BMI of 25 to 29.9 is considered overweight.  A BMI  of 30 and above is considered obese.  Maintain normal blood lipids and cholesterol levels by exercising and minimizing your intake of saturated fat. Eat a balanced diet with plenty of fruit and vegetables. Blood tests for lipids and cholesterol should begin at age 50 and be repeated every 5 years. If your lipid or cholesterol levels are high, you are over 50, or you are at high risk for heart disease, you may need your cholesterol levels checked more frequently.Ongoing high lipid and cholesterol levels should be treated with medicines if diet and exercise are not working.  If you smoke, find out from your health care provider how to quit. If you do not use tobacco, do not start.  Lung cancer screening is recommended for adults aged 73-80 years who are at high risk for developing lung cancer because of a history of smoking. A yearly low-dose CT scan of the lungs is recommended for people who have at least a 30-pack-year history of smoking and are a current smoker or have quit within the past 15 years. A pack year of smoking is smoking an average of 1 pack of cigarettes a day for 1 year (for example: 1 pack a day for 30 years or 2 packs a day for 15 years). Yearly screening should continue until the smoker has stopped smoking for at least 15 years. Yearly screening should be stopped for people who develop a health problem that would prevent them from having lung cancer treatment.  If you choose to drink alcohol, do not have more than  2 drinks per day. One drink is considered to be 12 ounces (355 mL) of beer, 5 ounces (148 mL) of wine, or 1.5 ounces (44 mL) of liquor.  Avoid use of street drugs. Do not share needles with anyone. Ask for help if you need support or instructions about stopping the use of drugs.  High blood pressure causes heart disease and increases the risk of stroke. Your blood pressure should be checked at least every 1-2 years. Ongoing high blood pressure should be treated with  medicines, if weight loss and exercise are not effective.  If you are 45-79 years old, ask your health care provider if you should take aspirin to prevent heart disease.  Diabetes screening involves taking a blood sample to check your fasting blood sugar level. This should be done once every 3 years, after age 45, if you are within normal weight and without risk factors for diabetes. Testing should be considered at a younger age or be carried out more frequently if you are overweight and have at least 1 risk factor for diabetes.  Colorectal cancer can be detected and often prevented. Most routine colorectal cancer screening begins at the age of 50 and continues through age 75. However, your health care provider may recommend screening at an earlier age if you have risk factors for colon cancer. On a yearly basis, your health care provider may provide home test kits to check for hidden blood in the stool. Use of a small camera at the end of a tube to directly examine the colon (sigmoidoscopy or colonoscopy) can detect the earliest forms of colorectal cancer. Talk to your health care provider about this at age 50, when routine screening begins. Direct exam of the colon should be repeated every 5-10 years through age 75, unless early forms of precancerous polyps or small growths are found.  People who are at an increased risk for hepatitis B should be screened for this virus. You are considered at high risk for hepatitis B if:  You were born in a country where hepatitis B occurs often. Talk with your health care provider about which countries are considered high risk.  Your parents were born in a high-risk country and you have not received a shot to protect against hepatitis B (hepatitis B vaccine).  You have HIV or AIDS.  You use needles to inject street drugs.  You live with, or have sex with, someone who has hepatitis B.  You are a man who has sex with other men (MSM).  You get hemodialysis  treatment.  You take certain medicines for conditions such as cancer, organ transplantation, and autoimmune conditions.  Hepatitis C blood testing is recommended for all people born from 1945 through 1965 and any individual with known risks for hepatitis C.  Practice safe sex. Use condoms and avoid high-risk sexual practices to reduce the spread of sexually transmitted infections (STIs). STIs include gonorrhea, chlamydia, syphilis, trichomonas, herpes, HPV, and human immunodeficiency virus (HIV). Herpes, HIV, and HPV are viral illnesses that have no cure. They can result in disability, cancer, and death.  If you are at risk of being infected with HIV, it is recommended that you take a prescription medicine daily to prevent HIV infection. This is called preexposure prophylaxis (PrEP). You are considered at risk if:  You are a man who has sex with other men (MSM) and have other risk factors.  You are a heterosexual man, are sexually active, and are at increased risk for HIV infection.    You take drugs by injection.  You are sexually active with a partner who has HIV.  Talk with your health care provider about whether you are at high risk of being infected with HIV. If you choose to begin PrEP, you should first be tested for HIV. You should then be tested every 3 months for as long as you are taking PrEP.  A one-time screening for abdominal aortic aneurysm (AAA) and surgical repair of large AAAs by ultrasound are recommended for men ages 32 to 67 years who are current or former smokers.  Healthy men should no longer receive prostate-specific antigen (PSA) blood tests as part of routine cancer screening. Talk with your health care provider about prostate cancer screening.  Testicular cancer screening is not recommended for adult males who have no symptoms. Screening includes self-exam, a health care provider exam, and other screening tests. Consult with your health care provider about any symptoms  you have or any concerns you have about testicular cancer.  Use sunscreen. Apply sunscreen liberally and repeatedly throughout the day. You should seek shade when your shadow is shorter than you. Protect yourself by wearing long sleeves, pants, a wide-brimmed hat, and sunglasses year round, whenever you are outdoors.  Once a month, do a whole-body skin exam, using a mirror to look at the skin on your back. Tell your health care provider about new moles, moles that have irregular borders, moles that are larger than a pencil eraser, or moles that have changed in shape or color.  Stay current with required vaccines (immunizations).  Influenza vaccine. All adults should be immunized every year.  Tetanus, diphtheria, and acellular pertussis (Td, Tdap) vaccine. An adult who has not previously received Tdap or who does not know his vaccine status should receive 1 dose of Tdap. This initial dose should be followed by tetanus and diphtheria toxoids (Td) booster doses every 10 years. Adults with an unknown or incomplete history of completing a 3-dose immunization series with Td-containing vaccines should begin or complete a primary immunization series including a Tdap dose. Adults should receive a Td booster every 10 years.  Varicella vaccine. An adult without evidence of immunity to varicella should receive 2 doses or a second dose if he has previously received 1 dose.  Human papillomavirus (HPV) vaccine. Males aged 68-21 years who have not received the vaccine previously should receive the 3-dose series. Males aged 22-26 years may be immunized. Immunization is recommended through the age of 6 years for any male who has sex with males and did not get any or all doses earlier. Immunization is recommended for any person with an immunocompromised condition through the age of 49 years if he did not get any or all doses earlier. During the 3-dose series, the second dose should be obtained 4-8 weeks after the first  dose. The third dose should be obtained 24 weeks after the first dose and 16 weeks after the second dose.  Zoster vaccine. One dose is recommended for adults aged 50 years or older unless certain conditions are present.  Measles, mumps, and rubella (MMR) vaccine. Adults born before [birth year] generally are considered immune to measles and mumps. Adults born in [birth year] or later should have 1 or more doses of MMR vaccine unless there is a contraindication to the vaccine or there is laboratory evidence of immunity to each of the three diseases. A routine second dose of MMR vaccine should be obtained at least 28 days after the first dose for students attending postsecondary  schools, health care workers, or international travelers. People who received inactivated measles vaccine or an unknown type of measles vaccine during 1963-1967 should receive 2 doses of MMR vaccine. People who received inactivated mumps vaccine or an unknown type of mumps vaccine before 1979 and are at high risk for mumps infection should consider immunization with 2 doses of MMR vaccine. Unvaccinated health care workers born before 1957 who lack laboratory evidence of measles, mumps, or rubella immunity or laboratory confirmation of disease should consider measles and mumps immunization with 2 doses of MMR vaccine or rubella immunization with 1 dose of MMR vaccine.  Pneumococcal 13-valent conjugate (PCV13) vaccine. When indicated, a person who is uncertain of his immunization history and has no record of immunization should receive the PCV13 vaccine. An adult aged 19 years or older who has certain medical conditions and has not been previously immunized should receive 1 dose of PCV13 vaccine. This PCV13 should be followed with a dose of pneumococcal polysaccharide (PPSV23) vaccine. The PPSV23 vaccine dose should be obtained at least 8 weeks after the dose of PCV13 vaccine. An adult aged 19 years or older who has certain medical conditions and  previously received 1 or more doses of PPSV23 vaccine should receive 1 dose of PCV13. The PCV13 vaccine dose should be obtained 1 or more years after the last PPSV23 vaccine dose.  Pneumococcal polysaccharide (PPSV23) vaccine. When PCV13 is also indicated, PCV13 should be obtained first. All adults aged 65 years and older should be immunized. An adult younger than age 65 years who has certain medical conditions should be immunized. Any person who resides in a nursing home or long-term care facility should be immunized. An adult smoker should be immunized. People with an immunocompromised condition and certain other conditions should receive both PCV13 and PPSV23 vaccines. People with human immunodeficiency virus (HIV) infection should be immunized as soon as possible after diagnosis. Immunization during chemotherapy or radiation therapy should be avoided. Routine use of PPSV23 vaccine is not recommended for American Indians, [place] Natives, or people younger than 65 years unless there are medical conditions that require PPSV23 vaccine. When indicated, people who have unknown immunization and have no record of immunization should receive PPSV23 vaccine. One-time revaccination 5 years after the first dose of PPSV23 is recommended for people aged 19-64 years who have chronic kidney failure, nephrotic syndrome, asplenia, or immunocompromised conditions. People who received 1-2 doses of PPSV23 before age 65 years should receive another dose of PPSV23 vaccine at age 65 years or later if at least 5 years have passed since the previous dose. Doses of PPSV23 are not needed for people immunized with PPSV23 at or after age 65 years.  Meningococcal vaccine. Adults with asplenia or persistent complement component deficiencies should receive 2 doses of quadrivalent meningococcal conjugate (MenACWY-D) vaccine. The doses should be obtained at least 2 months apart. Microbiologists working with certain meningococcal bacteria,  military recruits, people at risk during an outbreak, and people who travel to or live in countries with a high rate of meningitis should be immunized. A first-year college student up through age 21 years who is living in a residence hall should receive a dose if he did not receive a dose on or after his 16th birthday. Adults who have certain high-risk conditions should receive one or more doses of vaccine.  Hepatitis A vaccine. Adults who wish to be protected from this disease, have certain high-risk conditions, work with hepatitis A-infected animals, work in hepatitis A research labs, or   travel to or work in countries with a high rate of hepatitis A should be immunized. Adults who were previously unvaccinated and who anticipate close contact with an international adoptee during the first 60 days after arrival in the [place] from a country with a high rate of hepatitis A should be immunized.  Hepatitis B vaccine. Adults should be immunized if they wish to be protected from this disease, have certain high-risk conditions, may be exposed to blood or other infectious body fluids, are household contacts or sex partners of hepatitis B positive people, are clients or workers in certain care facilities, or travel to or work in countries with a high rate of hepatitis B.  Haemophilus influenzae type b (Hib) vaccine. A previously unvaccinated person with asplenia or sickle cell disease or having a scheduled splenectomy should receive 1 dose of Hib vaccine. Regardless of previous immunization, a recipient of a hematopoietic stem cell transplant should receive a 3-dose series 6-12 months after his successful transplant. Hib vaccine is not recommended for adults with HIV infection. Preventive Service / Frequency Ages 52 to 17  Blood pressure check.** / Every 1 to 2 years.  Lipid and cholesterol check.** / Every 5 years beginning at age 69.  Hepatitis C blood test.** / For any individual with known risks for  hepatitis C.  Skin self-exam. / Monthly.  Influenza vaccine. / Every year.  Tetanus, diphtheria, and acellular pertussis (Tdap, Td) vaccine.** / Consult your health care provider. 1 dose of Td every 10 years.  Varicella vaccine.** / Consult your health care provider.  HPV vaccine. / 3 doses over 6 months, if 72 or younger.  Measles, mumps, rubella (MMR) vaccine.** / You need at least 1 dose of MMR if you were born in 1957 or later. You may also need a second dose.  Pneumococcal 13-valent conjugate (PCV13) vaccine.** / Consult your health care provider.  Pneumococcal polysaccharide (PPSV23) vaccine.** / 1 to 2 doses if you smoke cigarettes or if you have certain conditions.  Meningococcal vaccine.** / 1 dose if you are age 35 to 60 years and a Market researcher living in a residence hall, or have one of several medical conditions. You may also need additional booster doses.  Hepatitis A vaccine.** / Consult your health care provider.  Hepatitis B vaccine.** / Consult your health care provider.  Haemophilus influenzae type b (Hib) vaccine.** / Consult your health care provider. Ages 35 to 8  Blood pressure check.** / Every 1 to 2 years.  Lipid and cholesterol check.** / Every 5 years beginning at age 57.  Lung cancer screening. / Every year if you are aged 44-80 years and have a 30-pack-year history of smoking and currently smoke or have quit within the past 15 years. Yearly screening is stopped once you have quit smoking for at least 15 years or develop a health problem that would prevent you from having lung cancer treatment.  Fecal occult blood test (FOBT) of stool. / Every year beginning at age 55 and continuing until age 73. You may not have to do this test if you get a colonoscopy every 10 years.  Flexible sigmoidoscopy** or colonoscopy.** / Every 5 years for a flexible sigmoidoscopy or every 10 years for a colonoscopy beginning at age 28 and continuing until age  1.  Hepatitis C blood test.** / For all people born from [birth year] through 1965 and any individual with known risks for hepatitis C.  Skin self-exam. / Monthly.  Influenza vaccine. / Every  year.  Tetanus, diphtheria, and acellular pertussis (Tdap/Td) vaccine.** / Consult your health care provider. 1 dose of Td every 10 years.  Varicella vaccine.** / Consult your health care provider.  Zoster vaccine.** / 1 dose for adults aged 53 years or older.  Measles, mumps, rubella (MMR) vaccine.** / You need at least 1 dose of MMR if you were born in 1957 or later. You may also need a second dose.  Pneumococcal 13-valent conjugate (PCV13) vaccine.** / Consult your health care provider.  Pneumococcal polysaccharide (PPSV23) vaccine.** / 1 to 2 doses if you smoke cigarettes or if you have certain conditions.  Meningococcal vaccine.** / Consult your health care provider.  Hepatitis A vaccine.** / Consult your health care provider.  Hepatitis B vaccine.** / Consult your health care provider.  Haemophilus influenzae type b (Hib) vaccine.** / Consult your health care provider. Ages 77 and over  Blood pressure check.** / Every 1 to 2 years.  Lipid and cholesterol check.**/ Every 5 years beginning at age 85.  Lung cancer screening. / Every year if you are aged 55-80 years and have a 30-pack-year history of smoking and currently smoke or have quit within the past 15 years. Yearly screening is stopped once you have quit smoking for at least 15 years or develop a health problem that would prevent you from having lung cancer treatment.  Fecal occult blood test (FOBT) of stool. / Every year beginning at age 33 and continuing until age 11. You may not have to do this test if you get a colonoscopy every 10 years.  Flexible sigmoidoscopy** or colonoscopy.** / Every 5 years for a flexible sigmoidoscopy or every 10 years for a colonoscopy beginning at age 28 and continuing until age 73.  Hepatitis C blood  test.** / For all people born from [birth year] through 1965 and any individual with known risks for hepatitis C.  Abdominal aortic aneurysm (AAA) screening.** / A one-time screening for ages 50 to 27 years who are current or former smokers.  Skin self-exam. / Monthly.  Influenza vaccine. / Every year.  Tetanus, diphtheria, and acellular pertussis (Tdap/Td) vaccine.** / 1 dose of Td every 10 years.  Varicella vaccine.** / Consult your health care provider.  Zoster vaccine.** / 1 dose for adults aged 34 years or older.  Pneumococcal 13-valent conjugate (PCV13) vaccine.** / Consult your health care provider.  Pneumococcal polysaccharide (PPSV23) vaccine.** / 1 dose for all adults aged 63 years and older.  Meningococcal vaccine.** / Consult your health care provider.  Hepatitis A vaccine.** / Consult your health care provider.  Hepatitis B vaccine.** / Consult your health care provider.  Haemophilus influenzae type b (Hib) vaccine.** / Consult your health care provider. **Family history and personal history of risk and conditions may change your health care provider's recommendations. Document Released: 12/16/2001 Document Revised: 10/25/2013 Document Reviewed: 03/17/2011 [facility] Patient Information 2015 [place], [place]. This information is not intended to replace advice given to you by your health care provider. Make sure you discuss any questions you have with your health care provider.

## 2015-06-28 NOTE — Progress Notes (Signed)
Pre visit review using our clinic review tool, if applicable. No additional management support is needed unless otherwise documented below in the visit note. 

## 2015-06-28 NOTE — Progress Notes (Signed)
Patient ID: Devin Ramos, male    DOB: [birth year]  Age: 49 y.o. MRN: [id number]    Subjective:  Subjective HPI Devin Ramos presents for cpe and labs.   No complaints.    Review of Systems  Constitutional: Negative.   HENT: Negative for congestion, ear pain, hearing loss, nosebleeds, postnasal drip, rhinorrhea, sinus pressure, sneezing and tinnitus.   Eyes: Negative for photophobia, discharge, itching and visual disturbance.  Respiratory: Negative.   Cardiovascular: Negative.   Gastrointestinal: Negative for abdominal pain, constipation, blood in stool, abdominal distention and anal bleeding.  Endocrine: Negative.   Genitourinary: Negative.   Musculoskeletal: Negative.   Skin: Negative.   Allergic/Immunologic: Negative.   Neurological: Negative for dizziness, weakness, light-headedness, numbness and headaches.  Psychiatric/Behavioral: Negative for suicidal ideas, confusion, sleep disturbance, dysphoric mood, decreased concentration and agitation. The patient is not nervous/anxious.   No Known Allergies Current Outpatient Prescriptions on File Prior to Visit  Medication Sig Dispense Refill  . carisoprodol (SOMA) 350 MG tablet Take 1 tablet (350 mg total) by mouth 4 (four) times daily as needed for muscle spasms. 30 tablet 0   No current facility-administered medications on file prior to visit.   Social History   Social History  . Marital Status: Married    Spouse Name: N/A  . Number of Children: N/A  . Years of Education: N/A   Occupational History  . [employer]    Social History Main Topics  . Smoking status: Never Smoker   . Smokeless tobacco: Not on file  . Alcohol Use: No     Comment: rare  . Drug Use: No  . Sexual Activity:    Partners: Female   Other Topics Concern  . Not on file   Social History Narrative   Exercise-- walking ,  Physical job    History Past Medical History  Diagnosis Date  . Environmental allergies     He has past surgical  history that includes Appendectomy.   His family history includes Cancer in his father; Diabetes in his mother; Heart disease in his paternal grandfather; Hypertension in his brother, father, and mother; Prostate cancer in his father; Stroke in his paternal grandfather.He reports that he has never smoked. He does not have any smokeless tobacco history on file. He reports that he does not drink alcohol or use illicit drugs.  Current Outpatient Prescriptions on File Prior to Visit  Medication Sig Dispense Refill  . carisoprodol (SOMA) 350 MG tablet Take 1 tablet (350 mg total) by mouth 4 (four) times daily as needed for muscle spasms. 30 tablet 0   No current facility-administered medications on file prior to visit.     Objective:  Objective Physical Exam  Constitutional: He is oriented to person, place, and time. He appears well-developed and well-nourished. No distress.  HENT:  Head: Normocephalic and atraumatic.  Right Ear: External ear normal.  Left Ear: External ear normal.  Nose: Nose normal.  Mouth/Throat: Oropharynx is clear and moist. No oropharyngeal exudate.  Eyes: Conjunctivae and EOM are normal. Pupils are equal, round, and reactive to light. Right eye exhibits no discharge. Left eye exhibits no discharge.  Neck: Normal range of motion. Neck supple. No JVD present. No thyromegaly present.  Cardiovascular: Normal rate, regular rhythm and intact distal pulses.  Exam reveals no gallop and no friction rub.   No murmur heard. Pulmonary/Chest: Effort normal and breath sounds normal. No respiratory distress. He has no wheezes. He has no rales. He exhibits no tenderness.  Abdominal: Soft. Bowel sounds are normal. He exhibits no distension and no mass. There is no tenderness. There is no rebound and no guarding.  Genitourinary: Rectum normal, prostate normal and penis normal. Guaiac negative stool.  Musculoskeletal: Normal range of motion. He exhibits no edema or tenderness.    Lymphadenopathy:    He has no cervical adenopathy.  Neurological: He is alert and oriented to person, place, and time. He displays normal reflexes. He exhibits normal muscle tone.  Skin: Skin is warm and dry. No rash noted. He is not diaphoretic. No erythema. No pallor.  Psychiatric: He has a normal mood and affect. His behavior is normal. Judgment and thought content normal.   BP 127/80 mmHg  Pulse 79  Temp(Src) 98.1 F (36.7 C) (Oral)  Ht 6\' 3"  (1.905 m)  Wt 216 lb 6.4 oz (98.158 kg)  BMI 27.05 kg/m2  SpO2 98% Wt Readings from Last 3 Encounters:  06/28/15 216 lb 6.4 oz (98.158 kg)  03/16/15 209 lb 3.2 oz (94.892 kg)  03/05/15 216 lb 12.8 oz (98.34 kg)     Lab Results  Component Value Date   WBC 9.8 03/01/2015   HGB 13.2 03/01/2015   HCT 37.6* 03/01/2015   PLT 190 03/01/2015   GLUCOSE 98 03/01/2015   CHOL 153 06/01/2014   TRIG 109.0 06/01/2014   HDL 34.70* 06/01/2014   LDLCALC 97 06/01/2014   ALT 17 03/01/2015   AST 9 03/01/2015   NA 140 03/01/2015   K 3.9 03/01/2015   CL 106 03/01/2015   CREATININE 1.11 03/01/2015   BUN 12 03/01/2015   CO2 30 03/01/2015   TSH 2.09 06/01/2014   PSA 1.33 06/01/2014    Dg Lumbar Spine Complete  03/05/2015   CLINICAL DATA:  Low back pain for years. Left groin pain for a week with no trauma.  EXAM: LUMBAR SPINE - COMPLETE 4+ VIEW  COMPARISON:  CT of the abdomen and pelvis of 03/01/2015  FINDINGS: Five lumbar type vertebral bodies. Sacroiliac joints are symmetric. Maintenance of vertebral body height and alignment. Degenerative disc disease at the lumbosacral junction is moderate.  IMPRESSION: Lumbosacral degenerative disc disease. No acute osseous abnormality.   Electronically Signed   By: Abigail Miyamoto M.D.   On: 03/05/2015 12:38   Dg Hip Unilat With Pelvis 2-3 Views Left  03/05/2015   CLINICAL DATA:  Low back pain.  Left groin pain for 1 week.  EXAM: LEFT HIP (WITH PELVIS) 2-3 VIEWS  COMPARISON:  None.  FINDINGS: Pelvic bony ring is  intact. Normal and symmetric appearance of the sacroiliac joints. No gross abnormality to either hip. The left hip is located without fracture.  IMPRESSION: No acute bone abnormality to the pelvis or left hip.   Electronically Signed   By: Markus Daft M.D.   On: 03/05/2015 12:35     Assessment & Plan:  Plan I am having Mr. Sossamon maintain his carisoprodol.  No orders of the defined types were placed in this encounter.    Problem List Items Addressed This Visit    Preventative health care - Primary    See AVS ghm utd Check labs rto 1 year or sooner prn      Relevant Orders   CBC with Differential   Comprehensive metabolic panel   Lipid panel   TSH   PSA(Must document that pt has been informed of limitations of PSA testing.)      Follow-up: Return in 1 year (on 06/27/2016) for CPE, annual exam, fasting.  Garnet Koyanagi, DO

## 2015-12-26 ENCOUNTER — Telehealth: Payer: Self-pay | Admitting: Family Medicine

## 2015-12-26 NOTE — Telephone Encounter (Signed)
"  wireless customer not available" to update flu record

## 2015-12-28 NOTE — Telephone Encounter (Signed)
    Expand All Collapse All   "wireless customer not available" to update flu record       2nd call Please update as declined and move out date

## 2015-12-28 NOTE — Telephone Encounter (Signed)
Noted.  Marked as declined in chart.

## 2016-07-24 ENCOUNTER — Ambulatory Visit (INDEPENDENT_AMBULATORY_CARE_PROVIDER_SITE_OTHER): Payer: BLUE CROSS/BLUE SHIELD | Admitting: Family Medicine

## 2016-07-24 ENCOUNTER — Encounter: Payer: Self-pay | Admitting: Family Medicine

## 2016-07-24 VITALS — BP 118/60 | HR 77 | Temp 99.0°F | Resp 16 | Ht 74.0 in | Wt 228.4 lb

## 2016-07-24 DIAGNOSIS — Z0000 Encounter for general adult medical examination without abnormal findings: Secondary | ICD-10-CM | POA: Diagnosis not present

## 2016-07-24 NOTE — Progress Notes (Signed)
Pre visit review using our clinic review tool, if applicable. No additional management support is needed unless otherwise documented below in the visit note. 

## 2016-07-24 NOTE — Patient Instructions (Signed)

## 2016-07-24 NOTE — Progress Notes (Signed)
Patient ID: Devin Ramos, male    DOB: November 30, 1965  Age: 50 y.o. MRN: [id number]    Subjective:  Subjective  HPI Devin Ramos presents for cpe.   Review of Systems  Constitutional: Negative.  Negative for appetite change, diaphoresis, fatigue and unexpected weight change.  HENT: Negative for congestion, ear pain, hearing loss, nosebleeds, postnasal drip, rhinorrhea, sinus pressure, sneezing and tinnitus.   Eyes: Negative for photophobia, pain, discharge, redness, itching and visual disturbance.  Respiratory: Negative.  Negative for cough, chest tightness, shortness of breath and wheezing.   Cardiovascular: Negative.  Negative for chest pain, palpitations and leg swelling.  Gastrointestinal: Negative for abdominal distention, abdominal pain, anal bleeding, blood in stool and constipation.  Endocrine: Negative.  Negative for cold intolerance, heat intolerance, polydipsia, polyphagia and polyuria.  Genitourinary: Negative.  Negative for difficulty urinating, dysuria and frequency.  Musculoskeletal: Negative.   Skin: Negative.   Allergic/Immunologic: Negative.   Neurological: Negative for dizziness, weakness, light-headedness, numbness and headaches.  Psychiatric/Behavioral: Negative for agitation, confusion, decreased concentration, dysphoric mood, sleep disturbance and suicidal ideas. The patient is not nervous/anxious.     History Past Medical History:  Diagnosis Date  . Environmental allergies     He has a past surgical history that includes Appendectomy.   His family history includes Cancer in his father; Diabetes in his mother; Heart disease in his paternal grandfather; Hypertension in his brother, father, and mother; Prostate cancer in his father; Stroke in his paternal grandfather.He reports that he has never smoked. He does not have any smokeless tobacco history on file. He reports that he does not drink alcohol or use drugs.  No current outpatient prescriptions on file  prior to visit.   No current facility-administered medications on file prior to visit.      Objective:  Objective  Physical Exam  Constitutional: He is oriented to person, place, and time. He appears well-developed and well-nourished. No distress.  HENT:  Head: Normocephalic and atraumatic.  Right Ear: External ear normal.  Left Ear: External ear normal.  Nose: Nose normal.  Mouth/Throat: Oropharynx is clear and moist. No oropharyngeal exudate.  Eyes: Conjunctivae and EOM are normal. Pupils are equal, round, and reactive to light. Right eye exhibits no discharge. Left eye exhibits no discharge.  Neck: Normal range of motion. Neck supple. No JVD present. No thyromegaly present.  Cardiovascular: Normal rate, regular rhythm and intact distal pulses.  Exam reveals no gallop and no friction rub.   No murmur heard. Pulmonary/Chest: Effort normal and breath sounds normal. No respiratory distress. He has no wheezes. He has no rales. He exhibits no tenderness.  Abdominal: Soft. Bowel sounds are normal. He exhibits no distension and no mass. There is no tenderness. There is no rebound and no guarding.  Genitourinary: Rectum normal, prostate normal and penis normal. Rectal exam shows guaiac negative stool.  Musculoskeletal: Normal range of motion. He exhibits no edema or tenderness.  Lymphadenopathy:    He has no cervical adenopathy.  Neurological: He is alert and oriented to person, place, and time. He displays normal reflexes. He exhibits normal muscle tone.  Skin: Skin is warm and dry. No rash noted. He is not diaphoretic. No erythema. No pallor.  Psychiatric: He has a normal mood and affect. His behavior is normal. Judgment and thought content normal.  Nursing note and vitals reviewed.  BP 118/60 (BP Location: Right Arm, Patient Position: Sitting, Cuff Size: Large)   Pulse 77   Temp 99 F (37.2 C) (Oral)  Resp 16   Ht 6\' 2"  (1.88 m)   Wt 228 lb 6.4 oz (103.6 kg)   SpO2 98%   BMI 29.32  kg/m  Wt Readings from Last 3 Encounters:  07/24/16 228 lb 6.4 oz (103.6 kg)  06/28/15 216 lb 6.4 oz (98.2 kg)  03/16/15 209 lb 3.2 oz (94.9 kg)     Lab Results  Component Value Date   WBC 6.2 06/28/2015   HGB 13.4 06/28/2015   HCT 38.8 (L) 06/28/2015   PLT 213.0 06/28/2015   GLUCOSE 94 06/28/2015   CHOL 158 06/28/2015   TRIG 123.0 06/28/2015   HDL 31.40 (L) 06/28/2015   LDLCALC 102 (H) 06/28/2015   ALT 15 06/28/2015   AST 7 06/28/2015   NA 141 06/28/2015   K 3.8 06/28/2015   CL 104 06/28/2015   CREATININE 0.92 06/28/2015   BUN 12 06/28/2015   CO2 30 06/28/2015   TSH 1.46 06/28/2015   PSA 0.50 06/28/2015    Dg Lumbar Spine Complete  Result Date: 03/05/2015 CLINICAL DATA:  Low back pain for years. Left groin pain for a week with no trauma. EXAM: LUMBAR SPINE - COMPLETE 4+ VIEW COMPARISON:  CT of the abdomen and pelvis of 03/01/2015 FINDINGS: Five lumbar type vertebral bodies. Sacroiliac joints are symmetric. Maintenance of vertebral body height and alignment. Degenerative disc disease at the lumbosacral junction is moderate. IMPRESSION: Lumbosacral degenerative disc disease. No acute osseous abnormality. Electronically Signed   By: Abigail Miyamoto M.D.   On: 03/05/2015 12:38   Dg Hip Unilat With Pelvis 2-3 Views Left  Result Date: 03/05/2015 CLINICAL DATA:  Low back pain.  Left groin pain for 1 week. EXAM: LEFT HIP (WITH PELVIS) 2-3 VIEWS COMPARISON:  None. FINDINGS: Pelvic bony ring is intact. Normal and symmetric appearance of the sacroiliac joints. No gross abnormality to either hip. The left hip is located without fracture. IMPRESSION: No acute bone abnormality to the pelvis or left hip. Electronically Signed   By: Markus Daft M.D.   On: 03/05/2015 12:35     Assessment & Plan:  Plan  I have discontinued Mr. Myrick's carisoprodol.  No orders of the defined types were placed in this encounter.   Problem List Items Addressed This Visit      Unprioritized   Preventative  health care - Primary   Relevant Orders   Comprehensive metabolic panel   Lipid panel   CBC with Differential/Platelet   POCT urinalysis dipstick   PSA   TSH    Other Visit Diagnoses   None.   see AVS Check labs   Follow-up: Return in about 1 year (around 07/24/2017) for annual exam, fasting.  Ann Held, DO

## 2016-07-28 ENCOUNTER — Other Ambulatory Visit: Payer: BLUE CROSS/BLUE SHIELD

## 2016-07-28 LAB — LIPID PANEL
CHOL/HDL RATIO: 5
Cholesterol: 162 mg/dL (ref 0–200)
HDL: 32.3 mg/dL — ABNORMAL LOW (ref >39.00)
LDL Cholesterol: 99 mg/dL (ref 0–99)
NONHDL: 130.01
Triglycerides: 154 mg/dL — ABNORMAL HIGH (ref 0.0–149.0)
VLDL: 30.8 mg/dL (ref 0.0–40.0)

## 2016-07-28 LAB — COMPREHENSIVE METABOLIC PANEL
ALBUMIN: 4 g/dL (ref 3.5–5.2)
ALK PHOS: 61 U/L (ref 39–117)
ALT: 22 U/L (ref 0–53)
AST: 8 U/L (ref 0–37)
BUN: 12 mg/dL (ref 6–23)
CO2: 30 mEq/L (ref 19–32)
Calcium: 8.7 mg/dL (ref 8.4–10.5)
Chloride: 106 mEq/L (ref 96–112)
Creatinine, Ser: 1.03 mg/dL (ref 0.40–1.50)
GFR: 81.84 mL/min (ref >60.00)
Glucose, Bld: 95 mg/dL (ref 70–99)
POTASSIUM: 4.3 meq/L (ref 3.5–5.1)
Sodium: 141 mEq/L (ref 135–145)
Total Bilirubin: 0.5 mg/dL (ref 0.2–1.2)
Total Protein: 6.4 g/dL (ref 6.0–8.3)

## 2016-07-28 LAB — TSH: TSH: 2.42 u[IU]/mL (ref 0.35–4.50)

## 2016-07-28 LAB — PSA: PSA: 0.41 ng/mL (ref 0.10–4.00)

## 2016-07-28 LAB — CBC WITH DIFFERENTIAL/PLATELET
BASOS ABS: 0 10*3/uL (ref 0.0–0.1)
Basophils Relative: 0.5 % (ref 0.0–3.0)
EOS ABS: 0.4 10*3/uL (ref 0.0–0.7)
EOS PCT: 5.5 % — AB (ref 0.0–5.0)
HCT: 39.4 % (ref 39.0–52.0)
HEMOGLOBIN: 13.7 g/dL (ref 13.0–17.0)
LYMPHS ABS: 1.5 10*3/uL (ref 0.7–4.0)
Lymphocytes Relative: 22.9 % (ref 12.0–46.0)
MCHC: 34.7 g/dL (ref 30.0–36.0)
MCV: 89.3 fl (ref 78.0–100.0)
MONO ABS: 0.3 10*3/uL (ref 0.1–1.0)
Monocytes Relative: 4.3 % (ref 3.0–12.0)
NEUTROS PCT: 66.8 % (ref 43.0–77.0)
Neutro Abs: 4.5 10*3/uL (ref 1.4–7.7)
Platelets: 228 10*3/uL (ref 150.0–400.0)
RBC: 4.41 Mil/uL (ref 4.22–5.81)
RDW: 12.8 % (ref 11.5–15.5)
WBC: 6.7 10*3/uL (ref 4.0–10.5)

## 2016-10-09 ENCOUNTER — Emergency Department (HOSPITAL_COMMUNITY): Payer: BLUE CROSS/BLUE SHIELD | Admitting: Anesthesiology

## 2016-10-09 ENCOUNTER — Ambulatory Visit (HOSPITAL_BASED_OUTPATIENT_CLINIC_OR_DEPARTMENT_OTHER)
Admission: EM | Admit: 2016-10-09 | Discharge: 2016-10-09 | Disposition: A | Discharge status: Home / Self Care | Payer: BLUE CROSS/BLUE SHIELD | Attending: Emergency Medicine | Admitting: Emergency Medicine

## 2016-10-09 ENCOUNTER — Encounter (HOSPITAL_BASED_OUTPATIENT_CLINIC_OR_DEPARTMENT_OTHER): Payer: Self-pay | Admitting: *Deleted

## 2016-10-09 ENCOUNTER — Ambulatory Visit (HOSPITAL_COMMUNITY)
Admission: RE | Admit: 2016-10-09 | Payer: BLUE CROSS/BLUE SHIELD | Source: Other Acute Inpatient Hospital | Admitting: Gastroenterology

## 2016-10-09 ENCOUNTER — Emergency Department (HOSPITAL_BASED_OUTPATIENT_CLINIC_OR_DEPARTMENT_OTHER): Payer: BLUE CROSS/BLUE SHIELD

## 2016-10-09 ENCOUNTER — Encounter (HOSPITAL_COMMUNITY)
Admission: EM | Disposition: A | Discharge status: Home / Self Care | Payer: Self-pay | Source: Home / Self Care | Attending: Emergency Medicine

## 2016-10-09 DIAGNOSIS — X58XXXA Exposure to other specified factors, initial encounter: Secondary | ICD-10-CM | POA: Diagnosis not present

## 2016-10-09 DIAGNOSIS — K222 Esophageal obstruction: Secondary | ICD-10-CM | POA: Diagnosis not present

## 2016-10-09 DIAGNOSIS — R1310 Dysphagia, unspecified: Secondary | ICD-10-CM | POA: Diagnosis not present

## 2016-10-09 DIAGNOSIS — W44F3XA Food entering into or through a natural orifice, initial encounter: Secondary | ICD-10-CM

## 2016-10-09 DIAGNOSIS — R112 Nausea with vomiting, unspecified: Secondary | ICD-10-CM

## 2016-10-09 DIAGNOSIS — T18128A Food in esophagus causing other injury, initial encounter: Secondary | ICD-10-CM

## 2016-10-09 DIAGNOSIS — R1319 Other dysphagia: Secondary | ICD-10-CM

## 2016-10-09 HISTORY — PX: ESOPHAGOGASTRODUODENOSCOPY (EGD) WITH PROPOFOL: SHX5813

## 2016-10-09 LAB — CBC WITH DIFFERENTIAL/PLATELET
BASOS ABS: 0.1 10*3/uL (ref 0.0–0.1)
Basophils Relative: 1 %
Eosinophils Absolute: 0.2 10*3/uL (ref 0.0–0.7)
Eosinophils Relative: 2 %
HEMATOCRIT: 40.9 % (ref 39.0–52.0)
HEMOGLOBIN: 14.7 g/dL (ref 13.0–17.0)
LYMPHS PCT: 8 %
Lymphs Abs: 1.1 10*3/uL (ref 0.7–4.0)
MCH: 30.9 pg (ref 26.0–34.0)
MCHC: 35.9 g/dL (ref 30.0–36.0)
MCV: 86.1 fL (ref 78.0–100.0)
MONO ABS: 0.6 10*3/uL (ref 0.1–1.0)
MONOS PCT: 4 %
NEUTROS ABS: 11.4 10*3/uL — AB (ref 1.7–7.7)
NEUTROS PCT: 85 %
Platelets: 245 10*3/uL (ref 150–400)
RBC: 4.75 MIL/uL (ref 4.22–5.81)
RDW: 12.8 % (ref 11.5–15.5)
WBC: 13.4 10*3/uL — ABNORMAL HIGH (ref 4.0–10.5)

## 2016-10-09 LAB — COMPREHENSIVE METABOLIC PANEL
ALK PHOS: 63 U/L (ref 38–126)
ALT: 22 U/L (ref 17–63)
AST: 14 U/L — AB (ref 15–41)
Albumin: 4.9 g/dL (ref 3.5–5.0)
Anion gap: 9 (ref 5–15)
BILIRUBIN TOTAL: 0.7 mg/dL (ref 0.3–1.2)
BUN: 12 mg/dL (ref 6–20)
CALCIUM: 9.4 mg/dL (ref 8.9–10.3)
CO2: 24 mmol/L (ref 22–32)
Chloride: 109 mmol/L (ref 101–111)
Creatinine, Ser: 0.9 mg/dL (ref 0.61–1.24)
GFR calc Af Amer: >60 mL/min (ref >60)
GLUCOSE: 136 mg/dL — AB (ref 65–99)
POTASSIUM: 3.9 mmol/L (ref 3.5–5.1)
Sodium: 142 mmol/L (ref 135–145)
TOTAL PROTEIN: 7.8 g/dL (ref 6.5–8.1)

## 2016-10-09 LAB — LIPASE, BLOOD: LIPASE: 18 U/L (ref 11–51)

## 2016-10-09 LAB — TROPONIN I: Troponin I: <0.03 ng/mL (ref <0.03)

## 2016-10-09 SURGERY — EGD (ESOPHAGOGASTRODUODENOSCOPY)
Anesthesia: Monitor Anesthesia Care

## 2016-10-09 SURGERY — ESOPHAGOGASTRODUODENOSCOPY (EGD) WITH PROPOFOL
Anesthesia: Monitor Anesthesia Care

## 2016-10-09 MED ORDER — FENTANYL CITRATE (PF) 100 MCG/2ML IJ SOLN
INTRAMUSCULAR | Status: DC | PRN
  Administered 2016-10-09 (×2): 50 ug via INTRAVENOUS

## 2016-10-09 MED ORDER — GI COCKTAIL ~~LOC~~
30.0000 mL | Freq: Once | ORAL | Status: AC
  Administered 2016-10-09: 30 mL via ORAL
  Filled 2016-10-09: qty 30

## 2016-10-09 MED ORDER — SODIUM CHLORIDE 0.9 % IV SOLN: INTRAVENOUS | Status: DC

## 2016-10-09 MED ORDER — GLUCAGON HCL RDNA (DIAGNOSTIC) 1 MG IJ SOLR
INTRAMUSCULAR | Status: AC
  Filled 2016-10-09: qty 1

## 2016-10-09 MED ORDER — MIDAZOLAM HCL 5 MG/5ML IJ SOLN
INTRAMUSCULAR | Status: DC | PRN
  Administered 2016-10-09: 2 mg via INTRAVENOUS

## 2016-10-09 MED ORDER — GLUCAGON HCL RDNA (DIAGNOSTIC) 1 MG IJ SOLR
INTRAMUSCULAR | Status: DC | PRN
  Administered 2016-10-09: 0.25 mg via INTRAVENOUS

## 2016-10-09 MED ORDER — FAMOTIDINE IN NACL 20-0.9 MG/50ML-% IV SOLN
20.0000 mg | Freq: Once | INTRAVENOUS | Status: AC
  Administered 2016-10-09: 20 mg via INTRAVENOUS
  Filled 2016-10-09: qty 50

## 2016-10-09 MED ORDER — PROMETHAZINE HCL 25 MG/ML IJ SOLN
INTRAMUSCULAR | Status: AC
Start: 2016-10-09 — End: 2016-10-09
  Administered 2016-10-09: 25 mg via INTRAVENOUS
  Filled 2016-10-09: qty 1

## 2016-10-09 MED ORDER — SODIUM CHLORIDE 0.9 % IV BOLUS (SEPSIS)
1000.0000 mL | Freq: Once | INTRAVENOUS | Status: AC
  Administered 2016-10-09: 1000 mL via INTRAVENOUS

## 2016-10-09 MED ORDER — PROPOFOL 10 MG/ML IV BOLUS
INTRAVENOUS | Status: DC | PRN
  Administered 2016-10-09 (×2): 50 mg via INTRAVENOUS

## 2016-10-09 MED ORDER — ONDANSETRON HCL 4 MG/2ML IJ SOLN
INTRAMUSCULAR | Status: DC | PRN
  Administered 2016-10-09: 4 mg via INTRAVENOUS

## 2016-10-09 MED ORDER — LACTATED RINGERS IV SOLN
INTRAVENOUS | Status: DC | PRN
  Administered 2016-10-09: 11:00:00 via INTRAVENOUS

## 2016-10-09 MED ORDER — PROPOFOL 500 MG/50ML IV EMUL
INTRAVENOUS | Status: DC | PRN
  Administered 2016-10-09: 50 ug/kg/min via INTRAVENOUS

## 2016-10-09 MED ORDER — ONDANSETRON HCL 4 MG/2ML IJ SOLN
4.0000 mg | Freq: Once | INTRAMUSCULAR | Status: AC
  Administered 2016-10-09: 4 mg via INTRAVENOUS
  Filled 2016-10-09: qty 2

## 2016-10-09 MED ORDER — METOCLOPRAMIDE HCL 5 MG/ML IJ SOLN
10.0000 mg | Freq: Once | INTRAMUSCULAR | Status: AC
  Administered 2016-10-09: 10 mg via INTRAVENOUS
  Filled 2016-10-09: qty 2

## 2016-10-09 MED ORDER — LACTATED RINGERS IV SOLN
INTRAVENOUS | Status: DC
  Administered 2016-10-09: 10:00:00 via INTRAVENOUS

## 2016-10-09 MED ORDER — PROMETHAZINE HCL 25 MG/ML IJ SOLN
25.0000 mg | Freq: Once | INTRAMUSCULAR | Status: AC
  Administered 2016-10-09: 25 mg via INTRAVENOUS

## 2016-10-09 NOTE — Op Note (Signed)
[facility] Patient Name: Devin Ramos Procedure Date : 10/09/2016 MRN: [id number] Attending MD: Ladene Artist , MD Date of Birth: July 14, 1966 CSN: [id number] Age: 50 Admit Type: Outpatient Procedure:                Upper GI endoscopy Indications:              Dysphagia, esophageal obstruction due to food                            impaction Providers:                Pricilla Riffle. Fuller Plan, MD, Cleda Daub, RN, Elspeth Cho Tech., Technician, Jacquiline Doe, CRNA Referring MD:             Joseph Berkshire, MD Medicines:                Monitored Anesthesia Care Complications:            No immediate complications. Estimated Blood Loss:     Estimated blood loss: none. Procedure:                Pre-Anesthesia Assessment:                           - Prior to the procedure, a History and Physical                            was performed, and patient medications and                            allergies were reviewed. The patient's tolerance of                            previous anesthesia was also reviewed. The risks                            and benefits of the procedure and the sedation                            options and risks were discussed with the patient.                            All questions were answered, and informed consent                            was obtained. Prior Anticoagulants: The patient has                            taken no previous anticoagulant or antiplatelet                            agents. ASA Grade Assessment: II - A patient with  mild systemic disease. After reviewing the risks                            and benefits, the patient was deemed in                            satisfactory condition to undergo the procedure.                           After obtaining informed consent, the endoscope was                            passed under direct vision. Throughout the           procedure, the patient's blood pressure, pulse, and                            oxygen saturations were monitored continuously. The                            [id number]) scope was introduced through the                            mouth, and advanced to the second part of duodenum.                            The upper GI endoscopy was somewhat difficult due                            to presence of food. The patient tolerated the                            procedure well. Scope In: Scope Out: Findings:      Food was found in the distal esophagus. Large amount of tough chicken.       Removal of food was accomplished.      LA Grade D (one or more mucosal breaks involving at least 75% of       esophageal circumference) esophagitis with no bleeding was found in the       distal esophagus. The      One mild benign-appearing, intrinsic stenosis was found at the       gastroesophageal junction. This measured 1.3 cm (inner diameter) and was       traversed.      The entire examined stomach was normal. The esophagus otherwise appeared       normal.      The duodenal bulb and second portion of the duodenum were normal. Impression:               - Food in the distal esophagus. Removal was                            successful.                           - LA Grade D non-reflux esophagitis.                           -  Benign-appearing esophageal stenosis.                           - Normal stomach.                           - Normal duodenal bulb and second portion of the                            duodenum. Moderate Sedation:      none/MAC Recommendation:           - Patient has a contact number available for                            emergencies. The signs and symptoms of potential                            delayed complications were discussed with the                            patient. Return to normal activities tomorrow.                            Written discharge  instructions were provided to the                            patient.                           -- Clear liquid diet for 2 hours, advance as                            tolerated to full liquid diet today, soft diet                            tomorrow. Avoid meats and breads until EGD with                            dilation.                           - Prilosec OTC 20 mg PO bid for 5 days then daily                            indefinitely.                           - Repeat upper endoscopy in a 2-4 weeks for                            esophageal dilation                           - Continue present medications. Procedure Code(s):        --- Professional ---  [id number], Esophagogastroduodenoscopy, flexible,                            transoral; with removal of foreign body(s) Diagnosis Code(s):        --- Professional ---                           [id number], Food in esophagus causing other injury,                            initial encounter                           K20.8, Other esophagitis                           K22.2, Esophageal obstruction                           R13.10, Dysphagia, unspecified CPT copyright 2016 [employer]. All rights reserved. The codes documented in this report are preliminary and upon coder review may  be revised to meet current compliance requirements. Ladene Artist, MD 10/09/2016 11:51:56 AM This report has been signed electronically. Number of Addenda: 0

## 2016-10-09 NOTE — H&P (Signed)
[facility] DEPT [facility] Provider Note   CSN: [id number] Arrival date & time: 10/09/16  0232     History              Chief Complaint    Chief Complaint  Patient presents with  . Emesis    HPI Devin Ramos is a 50 y.o. male.  Patient presents with complaints of nausea and vomiting. Patient reports that symptoms began this evening.  He reports that he had just started eating when the symptoms began. He felt like he started to have trouble swallowing. He had chicken in his mouth, spit it out. He does not think he swallowed any before symptoms began. He has had persistent vomiting, now dry heaves. There is no diarrhea. He has not had a fever. Patient reports that he feels cramping across his abdomen now since he had somewhat vomiting, but that was originally no pain.        Past Medical History:  Diagnosis Date  . Environmental allergies         Patient Active Problem List   Diagnosis Date Noted  . Preventative health care 06/28/2015  . Left hip pain 03/16/2015  . Sinusitis, acute maxillary 11/01/2014  . Pain of left hand 09/16/2013  . Viral URI 11/18/2011  . 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 ANEMIA, VITAMIN B12 DEFICIENCY 02/28/2009  . UNSPECIFIED MONONEURITIS OF LOWER LIMB 02/21/2009  . ALLERGIC RHINITIS 07/14/2007         Past Surgical History:  Procedure Laterality Date  . APPENDECTOMY         Home Medications               Prior to Admission medications   Not on File    Family History       Family History  Problem Relation Age of Onset  . Prostate cancer Father   . Hypertension Father   . Cancer Father     prostate  . Heart disease Paternal Grandfather   . Stroke Paternal Grandfather   . Hypertension Mother   . Diabetes Mother   . Hypertension Brother     Social History       Social History  Substance Use Topics  . Smoking status: Never Smoker  . Smokeless tobacco: Not on file  . Alcohol use  No     Comment: rare     Allergies           Patient has no known allergies.   Review of Systems Review of Systems  Gastrointestinal: Positive for nausea and vomiting.  All other systems reviewed and are negative.    Physical Exam Updated Vital Signs BP 123/77   Pulse 76   Temp 98.7 F (37.1 C) (Oral)   Resp 11   Ht 6\' 2"  (1.88 m)   Wt 230 lb (104.3 kg)   SpO2 97%   BMI 29.53 kg/m   Physical Exam  Constitutional: He is oriented to person, place, and time. He appears well-developed and well-nourished. No distress.  HENT:  Head: Normocephalic and atraumatic.  Right Ear: Hearing normal.  Left Ear: Hearing normal.  Nose: Nose normal.  Mouth/Throat: Oropharynx is clear and moist and mucous membranes are normal.  Eyes: Conjunctivae and EOM are normal. Pupils are equal, round, and reactive to light.  Neck: Normal range of motion. Neck supple.  Cardiovascular: Regular rhythm, S1 normal and S2 normal.  Exam reveals no gallop and no friction rub.   No murmur heard. Pulmonary/Chest: Effort  normal and breath sounds normal. No respiratory distress. He exhibits no tenderness.  Abdominal: Soft. Normal appearance and bowel sounds are normal. There is no hepatosplenomegaly. There is tenderness in the epigastric area. There is no rebound, no guarding, no tenderness at McBurney's point and negative Murphy's sign. No hernia.  Musculoskeletal: Normal range of motion.  Neurological: He is alert and oriented to person, place, and time. He has normal strength. No cranial nerve deficit or sensory deficit. Coordination normal. GCS eye subscore is 4. GCS verbal subscore is 5. GCS motor subscore is 6.  Skin: Skin is warm, dry and intact. No rash noted. No cyanosis.  Psychiatric: He has a normal mood and affect. His speech is normal and behavior is normal. Thought content normal.  Nursing note and vitals reviewed.    ED Treatments / Results  Labs (all labs ordered are listed,  but only abnormal results are displayed)      Labs Reviewed  CBC WITH DIFFERENTIAL/PLATELET - Abnormal; Notable for the following:       Result Value    WBC 13.4 (*)    Neutro Abs 11.4 (*)    All other components within normal limits  COMPREHENSIVE METABOLIC PANEL - Abnormal; Notable for the following:    Glucose, Bld 136 (*)    AST 14 (*)    All other components within normal limits  LIPASE, BLOOD  TROPONIN I    EKG      EKG Interpretation  Date/Time:                  Thursday October 09 2016 03:46:20 EST Ventricular Rate:   87 PR Interval:                        QRS Duration:        85 QT Interval:                      368 QTC Calculation:    443 R Axis:                         39 Text Interpretation:  Sinus rhythm Low voltage, precordial leads Otherwise within normal limits Confirmed by POLLINA  MD, CHRISTOPHER [id number]) on 10/09/2016 3:48:05 AM Also confirmed by Betsey Holiday  MD, [place] [phone]), editor [place]  CCT, [place] ([id number])  on 10/09/2016 6:56:30 AM      Radiology  Imaging Results (Last 48 hours)  Dg Abd Acute W/chest  Result Date: 10/09/2016 CLINICAL DATA:  Epigastric pain with nausea and vomiting for 9 hours. Leukocytosis. EXAM: DG ABDOMEN ACUTE W/ 1V CHEST COMPARISON:  03/01/2015 FINDINGS: There is no evidence of dilated bowel loops or free intraperitoneal air. No radiopaque calculi or other significant radiographic abnormality is seen. Heart size and mediastinal contours are within normal limits. Both lungs are clear. IMPRESSION: Negative abdominal radiographs.  No acute cardiopulmonary disease. Electronically Signed   By: Andreas Newport M.D.   On: 10/09/2016 03:33     Procedures Procedures (including critical care time)  Medications Ordered in ED Medications  sodium chloride 0.9 % bolus 1,000 mL (0 mLs Intravenous Stopped 10/09/16 0433)  ondansetron (ZOFRAN) injection 4 mg (4 mg Intravenous Given 10/09/16 0258)  metoCLOPramide  (REGLAN) injection 10 mg (10 mg Intravenous Given 10/09/16 0300)  promethazine (PHENERGAN) injection 25 mg (25 mg Intravenous Given 10/09/16 0344)  sodium chloride 0.9 % bolus 1,000 mL (0 mLs Intravenous Stopped 10/09/16  [id number])  famotidine (PEPCID) IVPB 20 mg premix (0 mg Intravenous Stopped 10/09/16 0642)  gi cocktail (Maalox,Lidocaine,Donnatal) (30 mLs Oral Given 10/09/16 [id number])     Initial Impression / Assessment and Plan / ED Course  I have reviewed the triage vital signs and the nursing notes.  Pertinent labs & imaging results that were available during my care of the patient were reviewed by me and considered in my medical decision making (see chart for details).  Clinical Course   Patient presented to the ER with several hours of nausea and vomiting. Symptoms began acutely. He has had inability to hold down any solids or liquids over the last several hours. He has developed some slight epigastric discomfort. Exam did reveal mild tenderness in this region, no guarding or rebound. No focal tenderness or signs of acute surgical process. Patient treated with IV hydration and antibiotics. Vomiting is now controlled. Patient has leukocytosis, otherwise lab work is entirely normal. Leukocytosis nonspecific finding, likely secondary to the vomiting. No sign of bacterial infection. Repeat examination reveals no tenderness after treatment.  Patient reports that he is still feeling like he is having trouble swallowing. He continues to spit up clear liquid, likely his secretions. He is unable to swallow water. As soon as he swallows water, it comes back up. This is concerning for possible esophageal impaction.   Gastroenterology on-call was paged. There was delay when unassigned GI [facility]) was contacted first. Patient has Financial controller Primary care, so [facility] was ultimately contacted and he has been accepted for transfer to [facility] emergency department for evaluation for endoscopy.  Final Clinical  Impressions(s) / ED Diagnoses   Final diagnoses:  Non-intractable vomiting with nausea, unspecified vomiting type  Esophageal obstruction due to food impaction    New Prescriptions    New Prescriptions   No medications on file     Orpah Greek, MD 10/09/16 [phone]

## 2016-10-09 NOTE — ED Provider Notes (Signed)
[facility] DEPT [facility] Provider Note   CSN: [id number] Arrival date & time: 10/09/16  0232     History   Chief Complaint Chief Complaint  Patient presents with  . Emesis    HPI Devin Ramos is a 50 y.o. male.  Patient presents with complaints of nausea and vomiting. Patient reports that symptoms began this evening.  He reports that he had just started eating when the symptoms began. He felt like he started to have trouble swallowing. He had chicken in his mouth, spit it out. He does not think he swallowed any before symptoms began. He has had persistent vomiting, now dry heaves. There is no diarrhea. He has not had a fever. Patient reports that he feels cramping across his abdomen now since he had somewhat vomiting, but that was originally no pain.      Past Medical History:  Diagnosis Date  . Environmental allergies     Patient Active Problem List   Diagnosis Date Noted  . Preventative health care 06/28/2015  . Left hip pain 03/16/2015  . Sinusitis, acute maxillary 11/01/2014  . Pain of left hand 09/16/2013  . Viral URI 11/18/2011  . [place] DISEASE, LUMBAR 08/16/2009  . ANEMIA, VITAMIN B12 DEFICIENCY 02/28/2009  . UNSPECIFIED MONONEURITIS OF LOWER LIMB 02/21/2009  . ALLERGIC RHINITIS 07/14/2007    Past Surgical History:  Procedure Laterality Date  . APPENDECTOMY         Home Medications    Prior to Admission medications   Not on File    Family History Family History  Problem Relation Age of Onset  . Prostate cancer Father   . Hypertension Father   . Cancer Father     prostate  . Heart disease Paternal Grandfather   . Stroke Paternal Grandfather   . Hypertension Mother   . Diabetes Mother   . Hypertension Brother     Social History Social History  Substance Use Topics  . Smoking status: Never Smoker  . Smokeless tobacco: Not on file  . Alcohol use No     Comment: rare     Allergies   Patient has no known allergies.   Review of  Systems Review of Systems  Gastrointestinal: Positive for nausea and vomiting.  All other systems reviewed and are negative.    Physical Exam Updated Vital Signs BP 123/77   Pulse 76   Temp 98.7 F (37.1 C) (Oral)   Resp 11   Ht 6\' 2"  (1.88 m)   Wt 230 lb (104.3 kg)   SpO2 97%   BMI 29.53 kg/m   Physical Exam  Constitutional: He is oriented to person, place, and time. He appears well-developed and well-nourished. No distress.  HENT:  Head: Normocephalic and atraumatic.  Right Ear: Hearing normal.  Left Ear: Hearing normal.  Nose: Nose normal.  Mouth/Throat: Oropharynx is clear and moist and mucous membranes are normal.  Eyes: Conjunctivae and EOM are normal. Pupils are equal, round, and reactive to light.  Neck: Normal range of motion. Neck supple.  Cardiovascular: Regular rhythm, S1 normal and S2 normal.  Exam reveals no gallop and no friction rub.   No murmur heard. Pulmonary/Chest: Effort normal and breath sounds normal. No respiratory distress. He exhibits no tenderness.  Abdominal: Soft. Normal appearance and bowel sounds are normal. There is no hepatosplenomegaly. There is tenderness in the epigastric area. There is no rebound, no guarding, no tenderness at McBurney's point and negative Murphy's sign. No hernia.  Musculoskeletal: Normal range of motion.  Neurological: He is alert and oriented to person, place, and time. He has normal strength. No cranial nerve deficit or sensory deficit. Coordination normal. GCS eye subscore is 4. GCS verbal subscore is 5. GCS motor subscore is 6.  Skin: Skin is warm, dry and intact. No rash noted. No cyanosis.  Psychiatric: He has a normal mood and affect. His speech is normal and behavior is normal. Thought content normal.  Nursing note and vitals reviewed.    ED Treatments / Results  Labs (all labs ordered are listed, but only abnormal results are displayed) Labs Reviewed  CBC WITH DIFFERENTIAL/PLATELET - Abnormal; Notable for  the following:       Result Value   WBC 13.4 (*)    Neutro Abs 11.4 (*)    All other components within normal limits  COMPREHENSIVE METABOLIC PANEL - Abnormal; Notable for the following:    Glucose, Bld 136 (*)    AST 14 (*)    All other components within normal limits  LIPASE, BLOOD  TROPONIN I    EKG  EKG Interpretation  Date/Time:  Thursday October 09 2016 03:46:20 EST Ventricular Rate:  87 PR Interval:    QRS Duration: 85 QT Interval:  368 QTC Calculation: 443 R Axis:   39 Text Interpretation:  Sinus rhythm Low voltage, precordial leads Otherwise within normal limits Confirmed by Aleksia Freiman  MD, Blayde Bacigalupi [id number]) on 10/09/2016 3:48:05 AM Also confirmed by Betsey Holiday  MD, [place] [phone]), editor [place]  CCT, [place] ([id number])  on 10/09/2016 6:56:30 AM       Radiology Dg Abd Acute W/chest  Result Date: 10/09/2016 CLINICAL DATA:  Epigastric pain with nausea and vomiting for 9 hours. Leukocytosis. EXAM: DG ABDOMEN ACUTE W/ 1V CHEST COMPARISON:  03/01/2015 FINDINGS: There is no evidence of dilated bowel loops or free intraperitoneal air. No radiopaque calculi or other significant radiographic abnormality is seen. Heart size and mediastinal contours are within normal limits. Both lungs are clear. IMPRESSION: Negative abdominal radiographs.  No acute cardiopulmonary disease. Electronically Signed   By: Andreas Newport M.D.   On: 10/09/2016 03:33    Procedures Procedures (including critical care time)  Medications Ordered in ED Medications  sodium chloride 0.9 % bolus 1,000 mL (0 mLs Intravenous Stopped 10/09/16 0433)  ondansetron (ZOFRAN) injection 4 mg (4 mg Intravenous Given 10/09/16 0258)  metoCLOPramide (REGLAN) injection 10 mg (10 mg Intravenous Given 10/09/16 0300)  promethazine (PHENERGAN) injection 25 mg (25 mg Intravenous Given 10/09/16 0344)  sodium chloride 0.9 % bolus 1,000 mL (0 mLs Intravenous Stopped 10/09/16 0557)  famotidine (PEPCID) IVPB 20 mg premix (0 mg  Intravenous Stopped 10/09/16 0642)  gi cocktail (Maalox,Lidocaine,Donnatal) (30 mLs Oral Given 10/09/16 [id number])     Initial Impression / Assessment and Plan / ED Course  I have reviewed the triage vital signs and the nursing notes.  Pertinent labs & imaging results that were available during my care of the patient were reviewed by me and considered in my medical decision making (see chart for details).  Clinical Course   Patient presented to the ER with several hours of nausea and vomiting. Symptoms began acutely. He has had inability to hold down any solids or liquids over the last several hours. He has developed some slight epigastric discomfort. Exam did reveal mild tenderness in this region, no guarding or rebound. No focal tenderness or signs of acute surgical process. Patient treated with IV hydration and antibiotics. Vomiting is now controlled. Patient has leukocytosis, otherwise lab work is entirely normal.  Leukocytosis nonspecific finding, likely secondary to the vomiting. No sign of bacterial infection. Repeat examination reveals no tenderness after treatment.  Patient reports that he is still feeling like he is having trouble swallowing. He continues to spit up clear liquid, likely his secretions. He is unable to swallow water. As soon as he swallows water, it comes back up. This is concerning for possible esophageal impaction.   Gastroenterology on-call was paged. There was delay when unassigned GI [facility]) was contacted first. Patient has Financial controller Primary care, so [facility] was ultimately contacted and he has been accepted for transfer to [facility] emergency department for evaluation for endoscopy.  Final Clinical Impressions(s) / ED Diagnoses   Final diagnoses:  Non-intractable vomiting with nausea, unspecified vomiting type  Esophageal obstruction due to food impaction    New Prescriptions New Prescriptions   No medications on file     Orpah Greek, MD 10/09/16  [phone]

## 2016-10-09 NOTE — Anesthesia Preprocedure Evaluation (Signed)
Anesthesia Evaluation  Patient identified by MRN, date of birth, ID band Patient awake    Reviewed: Allergy & Precautions, NPO status , Patient's Chart, lab work & pertinent test results  Airway Mallampati: II  TM Distance: >3 FB Neck ROM: Full    Dental no notable dental hx.    Pulmonary neg pulmonary ROS,    Pulmonary exam normal breath sounds clear to auscultation       Cardiovascular negative cardio ROS Normal cardiovascular exam Rhythm:Regular Rate:Normal     Neuro/Psych negative neurological ROS  negative psych ROS   GI/Hepatic negative GI ROS, Neg liver ROS,   Endo/Other  negative endocrine ROS  Renal/GU negative Renal ROS  negative genitourinary   Musculoskeletal negative musculoskeletal ROS (+)   Abdominal   Peds negative pediatric ROS (+)  Hematology negative hematology ROS (+)   Anesthesia Other Findings   Reproductive/Obstetrics negative OB ROS                             Anesthesia Physical Anesthesia Plan  ASA: I  Anesthesia Plan: MAC   Post-op Pain Management:    Induction: Intravenous  Airway Management Planned: Nasal Cannula  Additional Equipment:   Intra-op Plan:   Post-operative Plan:   Informed Consent: I have reviewed the patients History and Physical, chart, labs and discussed the procedure including the risks, benefits and alternatives for the proposed anesthesia with the patient or authorized representative who has indicated his/her understanding and acceptance.   Dental advisory given  Plan Discussed with: CRNA and Surgeon  Anesthesia Plan Comments:         Anesthesia Quick Evaluation  

## 2016-10-09 NOTE — Transfer of Care (Signed)
Immediate Anesthesia Transfer of Care Note  Patient: Devin Ramos  Procedure(s) Performed: Procedure(s): ESOPHAGOGASTRODUODENOSCOPY (EGD) WITH PROPOFOL (N/A)  Patient Location: Endoscopy Unit  Anesthesia Type:MAC  Level of Consciousness: awake, oriented, sedated, patient cooperative and responds to stimulation  Airway & Oxygen Therapy: Patient Spontanous Breathing and Patient connected to nasal cannula oxygen  Post-op Assessment: Report given to RN, Post -op Vital signs reviewed and stable, Patient moving all extremities and Patient moving all extremities X 4  Post vital signs: Reviewed and stable  Last Vitals:  Vitals:   10/09/16 0949 10/09/16 1146  BP: (!) 155/89 113/64  Pulse: 84 84  Resp: 18 18  Temp: 36.9 C     Last Pain:  Vitals:   10/09/16 1146  TempSrc: Oral  PainSc:       Patients Stated Pain Goal: 5 ([id number])  Complications: No apparent anesthesia complications

## 2016-10-09 NOTE — Anesthesia Postprocedure Evaluation (Signed)
Anesthesia Post Note  Patient: Devin Ramos  Procedure(s) Performed: Procedure(s) (LRB): ESOPHAGOGASTRODUODENOSCOPY (EGD) WITH PROPOFOL (N/A)  Patient location during evaluation: PACU Anesthesia Type: MAC Level of consciousness: awake and alert Pain management: pain level controlled Vital Signs Assessment: post-procedure vital signs reviewed and stable Respiratory status: spontaneous breathing, nonlabored ventilation, respiratory function stable and patient connected to nasal cannula oxygen Cardiovascular status: blood pressure returned to baseline and stable Postop Assessment: no signs of nausea or vomiting Anesthetic complications: no    Last Vitals:  Vitals:   10/09/16 1210 10/09/16 1220  BP: 113/62 119/71  Pulse:    Resp:    Temp:      Last Pain:  Vitals:   10/09/16 1146  TempSrc: Oral  PainSc:                  [place] S

## 2016-10-09 NOTE — Discharge Instructions (Signed)
YOU HAD AN ENDOSCOPIC PROCEDURE TODAY: Refer to the procedure report and other information in the discharge instructions given to you for any specific questions about what was found during the examination. If this information does not answer your questions, please call [place] office at [phone] to clarify.   YOU SHOULD EXPECT: Some feelings of bloating in the abdomen. Passage of more gas than usual. Walking can help get rid of the air that was put into your GI tract during the procedure and reduce the bloating. Some abdominal soreness may be present for a day or two, also.  DIET: Your first meal following the procedure should be a light meal and then it is ok to progress to your normal diet. A half-sandwich or bowl of soup is an example of a good first meal. Heavy or fried foods are harder to digest and may make you feel nauseous or bloated. Drink plenty of fluids but you should avoid alcoholic beverages for 24 hours. If you had a esophageal dilation, please see attached instructions for diet.    ACTIVITY: Your care partner should take you home directly after the procedure. You should plan to take it easy, moving slowly for the rest of the day. You can resume normal activity the day after the procedure however YOU SHOULD NOT DRIVE, use power tools, machinery or perform tasks that involve climbing or major physical exertion for 24 hours (because of the sedation medicines used during the test).   SYMPTOMS TO REPORT IMMEDIATELY: A gastroenterologist can be reached at any hour. Please call [phone]  for any of the following symptoms:   Following upper endoscopy (EGD, EUS, ERCP, esophageal dilation) Vomiting of blood or coffee ground material  New, significant abdominal pain  New, significant chest pain or pain under the shoulder blades  Painful or persistently difficult swallowing  New shortness of breath  Black, tarry-looking or red, bloody stools  FOLLOW UP:  If any biopsies were taken you  will be contacted by phone or by letter within the next 1-3 weeks. Call [phone]  if you have not heard about the biopsies in 3 weeks.  Please also call with any specific questions about appointments or follow up tests.  

## 2016-10-09 NOTE — ED Triage Notes (Signed)
N/v onset last pm, denies diarrhea. Throat pain from dry heaves

## 2016-10-09 NOTE — ED Notes (Signed)
Pt attempted to drink water with spitting it back up right after; pt speaking in complete sentences but states feels like something is in his throat.

## 2016-10-09 NOTE — ED Notes (Signed)
Spoke with Kathlee Nations, RN endo at [facility]. States pt can come directly to Endo instead of going to ED first. Doren Custard with [facility] notified and is sending a truck for transport

## 2016-10-10 ENCOUNTER — Encounter (HOSPITAL_COMMUNITY): Payer: Self-pay | Admitting: Gastroenterology

## 2016-10-10 ENCOUNTER — Telehealth: Payer: Self-pay

## 2016-10-10 NOTE — Telephone Encounter (Signed)
Per procedure report patient needs repeat EGD with dilation.   Left message for patient to call back

## 2016-10-13 ENCOUNTER — Encounter: Payer: Self-pay | Admitting: Gastroenterology

## 2016-10-13 NOTE — Telephone Encounter (Signed)
Patient has been scheduled for pre-visit and EGD

## 2016-10-16 ENCOUNTER — Telehealth: Payer: Self-pay | Admitting: Gastroenterology

## 2016-10-16 NOTE — Telephone Encounter (Signed)
Left message for patient to call back  

## 2016-10-16 NOTE — Telephone Encounter (Signed)
Patient has a work party on Saturday.  He is advised he should avoid solid meat and bread.  He is advised that if he absolutely can't he needs to chew very carefully, follow each bite with a drink. He will call back for any additional questions or concerns.

## 2016-10-30 ENCOUNTER — Ambulatory Visit (AMBULATORY_SURGERY_CENTER): Payer: Self-pay | Admitting: *Deleted

## 2016-10-30 VITALS — Ht 74.0 in | Wt 221.8 lb

## 2016-10-30 DIAGNOSIS — R1310 Dysphagia, unspecified: Secondary | ICD-10-CM

## 2016-10-30 NOTE — Progress Notes (Signed)
No egg or soy allergy  No anesthesia or intubation problems per pt  No diet medications taken  No home oxygen used or hx of sleep apnea 

## 2016-11-17 ENCOUNTER — Encounter: Payer: Self-pay | Admitting: Gastroenterology

## 2016-11-24 ENCOUNTER — Encounter: Payer: Self-pay | Admitting: Gastroenterology

## 2016-11-24 ENCOUNTER — Ambulatory Visit (AMBULATORY_SURGERY_CENTER): Payer: BLUE CROSS/BLUE SHIELD | Admitting: Gastroenterology

## 2016-11-24 VITALS — BP 111/70 | HR 88 | Temp 99.1°F | Resp 19 | Ht 74.0 in | Wt 221.0 lb

## 2016-11-24 DIAGNOSIS — R1310 Dysphagia, unspecified: Secondary | ICD-10-CM

## 2016-11-24 DIAGNOSIS — K222 Esophageal obstruction: Secondary | ICD-10-CM

## 2016-11-24 DIAGNOSIS — K209 Esophagitis, unspecified without bleeding: Secondary | ICD-10-CM

## 2016-11-24 MED ORDER — SODIUM CHLORIDE 0.9 % IV SOLN: 500.0000 mL | INTRAVENOUS | Status: DC

## 2016-11-24 NOTE — Progress Notes (Signed)
Called to room to assist during endoscopic procedure.  Patient ID and intended procedure confirmed with present staff. Received instructions for my participation in the procedure from the performing physician.  

## 2016-11-24 NOTE — Patient Instructions (Signed)
YOU HAD AN ENDOSCOPIC PROCEDURE TODAY AT [facility]:   Refer to the procedure report that was given to you for any specific questions about what was found during the examination.  If the procedure report does not answer your questions, please call your gastroenterologist to clarify.  If you requested that your care partner not be given the details of your procedure findings, then the procedure report has been included in a sealed envelope for you to review at your convenience later.  YOU SHOULD EXPECT: Some feelings of bloating in the abdomen. Passage of more gas than usual.  Walking can help get rid of the air that was put into your GI tract during the procedure and reduce the bloating. If you had a lower endoscopy (such as a colonoscopy or flexible sigmoidoscopy) you may notice spotting of blood in your stool or on the toilet paper. If you underwent a bowel prep for your procedure, you may not have a normal bowel movement for a few days.  Please Note:  You might notice some irritation and congestion in your nose or some drainage.  This is from the oxygen used during your procedure.  There is no need for concern and it should clear up in a day or so.  SYMPTOMS TO REPORT IMMEDIATELY:    Following upper endoscopy (EGD)  Vomiting of blood or coffee ground material  New chest pain or pain under the shoulder blades  Painful or persistently difficult swallowing  New shortness of breath  Fever of 100F or higher  Black, tarry-looking stools  For urgent or emergent issues, a gastroenterologist can be reached at any hour by calling [phone]. Please read all handouts given to you by your recovery nurse.   DIET:  Clear liquid diet starting at 11:15 for 2 hours then soft diet the remainder of the day. May return to normal diet in the am.  ACTIVITY:  You should plan to take it easy for the rest of today and you should NOT DRIVE or use heavy machinery until tomorrow (because of the  sedation medicines used during the test).    FOLLOW UP: Our staff will call the number listed on your records the next business day following your procedure to check on you and address any questions or concerns that you may have regarding the information given to you following your procedure. If we do not reach you, we will leave a message.  However, if you are feeling well and you are not experiencing any problems, there is no need to return our call.  We will assume that you have returned to your regular daily activities without incident.  If any biopsies were taken you will be contacted by phone or by letter within the next 1-3 weeks.  Please call us at [phone] if you have not heard about the biopsies in 3 weeks.    SIGNATURES/CONFIDENTIALITY: You and/or your care partner have signed paperwork which will be entered into your electronic medical record.  These signatures attest to the fact that that the information above on your After Visit Summary has been reviewed and is understood.  Full responsibility of the confidentiality of this discharge information lies with you and/or your care-partner.  Thank you for letting us take care of your healthcare needs today.

## 2016-11-24 NOTE — Op Note (Addendum)
[facility] Patient Name: Devin Ramos Procedure Date: 11/24/2016 9:53 AM MRN: [id number] Endoscopist: Ladene Artist , MD Age: 51 Referring MD:  Date of Birth: 1965-11-07 Gender: Male Account #: [id number] Procedure:                Upper GI endoscopy Indications:              Dysphagia, Stricture of the esophagus Medicines:                Monitored Anesthesia Care Procedure:                Pre-Anesthesia Assessment:                           - Prior to the procedure, a History and Physical                            was performed, and patient medications and                            allergies were reviewed. The patient's tolerance of                            previous anesthesia was also reviewed. The risks                            and benefits of the procedure and the sedation                            options and risks were discussed with the patient.                            All questions were answered, and informed consent                            was obtained. Prior Anticoagulants: The patient has                            taken no previous anticoagulant or antiplatelet                            agents. ASA Grade Assessment: II - A patient with                            mild systemic disease. After reviewing the risks                            and benefits, the patient was deemed in                            satisfactory condition to undergo the procedure.                           After obtaining informed consent, the endoscope was  passed under direct vision. Throughout the                            procedure, the patient's blood pressure, pulse, and                            oxygen saturations were monitored continuously. The                            Model GIF-HQ190 [id number]) scope was introduced                            through the mouth, and advanced to the second part                            of duodenum.  The upper GI endoscopy was                            accomplished without difficulty. The patient                            tolerated the procedure well. Scope In: Scope Out: Findings:                 Mucosal changes including ringed esophagus,                            longitudinal furrows and white plaques were found                            in the middle third of the esophagus and in the                            lower third of the esophagus. Esophageal findings                            were graded using the Eosinophilic Esophagitis                            Endoscopic Reference Score (EoE-EREFS) as: Exudates                            Grade 1 Mild (scattered white lesions involving                            less than 10 percent of the esophageal surface                            area), Furrows Grade 1 Present (vertical lines with                            or without visible depth) and Stricture present.  Biopsies were taken with a cold forceps for                            histology.                           One mild benign-appearing, intrinsic stenosis was                            found at the gastroesophageal junction. This                            measured 1.3 cm (inner diameter) and was traversed.                            A guidewire was placed and the scope was withdrawn.                            Dilation was performed with Savary dilators with                            mild resistance at 14 mm and 15 mm. A trace of heme                            was noted.                           The exam of the esophagus was otherwise normal.                           A small hiatal hernia was present.                           The exam of the stomach was otherwise normal.                           The duodenal bulb and second portion of the                            duodenum were normal. Complications:            No immediate  complications. Estimated Blood Loss:     Estimated blood loss was minimal. Impression:               - Esophageal mucosal changes suggestive of                            eosinophilic esophagitis. Biopsied.                           - Benign-appearing esophageal stenosis. Dilated.                           - Small hiatal hernia.                           -  Normal duodenal bulb and second portion of the                            duodenum. Recommendation:           - Patient has a contact number available for                            emergencies. The signs and symptoms of potential                            delayed complications were discussed with the                            patient. Return to normal activities tomorrow.                            Written discharge instructions were provided to the                            patient.                           - Clear liquid diet for 2 hours then advance as                            tolerated to soft diet today. Resume prior diet                            tomorrow.                           - Continue present medications.                           - Await pathology results.                           - Return to GI office in 1 month. Ladene Artist, MD 11/24/2016 10:19:33 AM This report has been signed electronically.

## 2016-11-24 NOTE — Progress Notes (Signed)
Report given to PACU RN, vss 

## 2016-11-25 ENCOUNTER — Telehealth: Payer: Self-pay

## 2016-11-25 NOTE — Telephone Encounter (Signed)
  Follow up Call-  Call back number 11/24/2016  Post procedure Call Back phone  # [phone]  Permission to leave phone message Yes  Some recent data might be hidden    Patient was called for follow up after his procedure on 11/24/2016. No answer at the number given for follow up phone call. A message was left on the answering machine.

## 2016-11-25 NOTE — Telephone Encounter (Signed)
  Follow up Call-  Call back number 11/24/2016  Post procedure Call Back phone  # [phone]  Permission to leave phone message Yes  Some recent data might be hidden    Patient was called for follow up after his procedure on 11/24/2016. The patient's wife Devin Ramos reports that Devin Ramos started complaining of chills and then he felt like he had a fever. Temperature was 100.3. Per discharge instructions Devin Ramos began calling the number that was given on the AVS. Devin Ramos called from 8:30 to 10:45 last night. She was put on hold for 45 minutes the first time and the second time she was on hold for 32 minutes. Patient also called the [phone] number and again no one answered. The patient took tylenol and went to bed. This morning he woke up and has not had a fever all day. I assured the patient that their experience would be documented and certainly addressed with our Director and Manager so that this would not happen again.       Patient questions:  Do you have a fever, pain , or abdominal swelling? No. Pain Score  0 *  Have you tolerated food without any problems? Yes.    Have you been able to return to your normal activities? Yes.    Do you have any questions about your discharge instructions: Diet   No. Medications  No. Follow up visit  No.  Do you have questions or concerns about your Care? No.  Actions: * If pain score is 4 or above: No action needed, pain <4.

## 2016-12-04 ENCOUNTER — Encounter: Payer: Self-pay | Admitting: Gastroenterology

## 2016-12-24 ENCOUNTER — Telehealth: Payer: Self-pay | Admitting: Gastroenterology

## 2016-12-24 ENCOUNTER — Encounter (INDEPENDENT_AMBULATORY_CARE_PROVIDER_SITE_OTHER): Payer: Self-pay

## 2016-12-24 ENCOUNTER — Ambulatory Visit (INDEPENDENT_AMBULATORY_CARE_PROVIDER_SITE_OTHER): Payer: BLUE CROSS/BLUE SHIELD | Admitting: Gastroenterology

## 2016-12-24 ENCOUNTER — Encounter: Payer: Self-pay | Admitting: Gastroenterology

## 2016-12-24 VITALS — BP 102/70 | HR 80 | Ht 74.0 in | Wt 227.5 lb

## 2016-12-24 DIAGNOSIS — R1310 Dysphagia, unspecified: Secondary | ICD-10-CM | POA: Diagnosis not present

## 2016-12-24 DIAGNOSIS — K200 Eosinophilic esophagitis: Secondary | ICD-10-CM | POA: Diagnosis not present

## 2016-12-24 DIAGNOSIS — R1319 Other dysphagia: Secondary | ICD-10-CM

## 2016-12-24 MED ORDER — OMEPRAZOLE 40 MG PO CPDR: 40.0000 mg | DELAYED_RELEASE_CAPSULE | Freq: Every day | ORAL | 11 refills | Status: DC

## 2016-12-24 MED ORDER — FLUTICASONE PROPIONATE HFA 220 MCG/ACT IN AERO: INHALATION_SPRAY | RESPIRATORY_TRACT | 1 refills | Status: DC

## 2016-12-24 MED ORDER — AMBULATORY NON FORMULARY MEDICATION: 0 refills | Status: DC

## 2016-12-24 NOTE — Telephone Encounter (Signed)
Patient states budesonide is not covered by his insurance. Informed patient that it's a out of pocket expense since it's compounded. Informed patient that only other option is Flovent inhaler that the puffs are swallowed and not inhaled. Patient states he would like me to send that to his pharmacy to see if it's more affordable. Informed patient that I sent prescription and to let us know if he cannot afford this as well.

## 2016-12-24 NOTE — Patient Instructions (Signed)
We have sent the following medications to your pharmacy for you to pick up at your convenience: omeprazole 40 mg to take daily.   We have also send viscous budesonide 2 mg/30mL to [facility] in [facility] so they can compound this medication. You take 1mg /64mL twice daily x 8 weeks.   Thank you for choosing me and [facility].  Pricilla Riffle. Dagoberto Ligas., MD., Marval Regal

## 2016-12-24 NOTE — Progress Notes (Signed)
    History of Present Illness: This is a 51 year old male returning for follow-up of esophagitis and dysphagia. He relates no episodes of dysphagia since his EGD with dilation. He has had intermittent heartburn. He states he has significant sinus allergies which are not well controlled. He states he has undergone allergy testing with allergies to "almost everything".   EGD 11/24/2016 - Esophageal mucosal changes suggestive of eosinophilic esophagitis. Biopsied. - Benign-appearing esophageal stenosis. Dilated. - Small hiatal hernia. - Normal duodenal bulb and second portion of the duodenum. Surgical [P], esophageal - FRAGMENTS OF BENIGN SQUAMOUS MUCOSA WITH INCREASED EOSINOPHILS (UP TO 19/HIGH POWER FIELD). - THERE IS NO EVIDENCE OF MALIGNANCY.  EGD 10/09/2016 - Food in the distal esophagus. Removal was successful. - LA Grade D non-reflux esophagitis. - Benign-appearing esophageal stenosis. - Benign-appearing esophageal stenosis. - Normal stomach. - Normal duodenal bulb and second portion of the duodenum.  Current Medications, Allergies, Past Medical History, Past Surgical History, Family History and Social History were reviewed in [employer] record.  Physical Exam: General: Well developed, well nourished, no acute distress Head: Normocephalic and atraumatic Eyes:  sclerae anicteric, EOMI Psychological:  Alert and cooperative. Normal mood and affect  Assessment and Recommendations:  1. Eosinophilic esophagitis. Probably also had GERD. The disease states and their treatments were outlined. Patient had the opportunity to ask questions and I addressed them. Begin omeprazole 40 mg daily and standard antireflux measures. Budesonide 2 mg twice daily for 2 months. Return office visit in 2 months. I spent 15 minutes of face-to-face time with the patient. Greater than 50% of the time was spent counseling and coordinating care.

## 2017-02-25 ENCOUNTER — Ambulatory Visit: Payer: BLUE CROSS/BLUE SHIELD | Admitting: Gastroenterology

## 2017-08-06 ENCOUNTER — Ambulatory Visit (INDEPENDENT_AMBULATORY_CARE_PROVIDER_SITE_OTHER): Payer: BLUE CROSS/BLUE SHIELD | Admitting: Family Medicine

## 2017-08-06 ENCOUNTER — Encounter: Payer: Self-pay | Admitting: Family Medicine

## 2017-08-06 VITALS — BP 118/76 | HR 71 | Temp 98.2°F | Ht 76.0 in | Wt 232.0 lb

## 2017-08-06 DIAGNOSIS — Z125 Encounter for screening for malignant neoplasm of prostate: Secondary | ICD-10-CM

## 2017-08-06 DIAGNOSIS — Z0000 Encounter for general adult medical examination without abnormal findings: Secondary | ICD-10-CM

## 2017-08-06 DIAGNOSIS — R5383 Other fatigue: Secondary | ICD-10-CM | POA: Diagnosis not present

## 2017-08-06 LAB — COMPREHENSIVE METABOLIC PANEL
ALT: 17 U/L (ref 0–53)
AST: 6 U/L (ref 0–37)
Albumin: 4.4 g/dL (ref 3.5–5.2)
Alkaline Phosphatase: 61 U/L (ref 39–117)
BUN: 16 mg/dL (ref 6–23)
CALCIUM: 9.2 mg/dL (ref 8.4–10.5)
CHLORIDE: 106 meq/L (ref 96–112)
CO2: 29 meq/L (ref 19–32)
Creatinine, Ser: 0.89 mg/dL (ref 0.40–1.50)
GFR: 96.46 mL/min (ref >60.00)
Glucose, Bld: 92 mg/dL (ref 70–99)
Potassium: 4.1 mEq/L (ref 3.5–5.1)
Sodium: 141 mEq/L (ref 135–145)
Total Bilirubin: 0.6 mg/dL (ref 0.2–1.2)
Total Protein: 6.7 g/dL (ref 6.0–8.3)

## 2017-08-06 LAB — CBC WITH DIFFERENTIAL/PLATELET
BASOS PCT: 1.5 % (ref 0.0–3.0)
Basophils Absolute: 0.1 10*3/uL (ref 0.0–0.1)
Eosinophils Absolute: 0.3 10*3/uL (ref 0.0–0.7)
Eosinophils Relative: 4.1 % (ref 0.0–5.0)
HEMATOCRIT: 40.8 % (ref 39.0–52.0)
Hemoglobin: 14 g/dL (ref 13.0–17.0)
LYMPHS PCT: 23.5 % (ref 12.0–46.0)
Lymphs Abs: 1.5 10*3/uL (ref 0.7–4.0)
MCHC: 34.2 g/dL (ref 30.0–36.0)
MCV: 90.2 fl (ref 78.0–100.0)
MONO ABS: 0.3 10*3/uL (ref 0.1–1.0)
Monocytes Relative: 5.2 % (ref 3.0–12.0)
NEUTROS ABS: 4.2 10*3/uL (ref 1.4–7.7)
Neutrophils Relative %: 65.7 % (ref 43.0–77.0)
Platelets: 212 10*3/uL (ref 150.0–400.0)
RBC: 4.53 Mil/uL (ref 4.22–5.81)
RDW: 12.8 % (ref 11.5–15.5)
WBC: 6.4 10*3/uL (ref 4.0–10.5)

## 2017-08-06 LAB — LIPID PANEL
CHOL/HDL RATIO: 5
Cholesterol: 160 mg/dL (ref 0–200)
HDL: 33.5 mg/dL — ABNORMAL LOW (ref >39.00)
LDL CALC: 97 mg/dL (ref 0–99)
NonHDL: 126.3
Triglycerides: 147 mg/dL (ref 0.0–149.0)
VLDL: 29.4 mg/dL (ref 0.0–40.0)

## 2017-08-06 LAB — PSA: PSA: 0.55 ng/mL (ref 0.10–4.00)

## 2017-08-06 LAB — TSH: TSH: 1.56 u[IU]/mL (ref 0.35–4.50)

## 2017-08-06 NOTE — Progress Notes (Signed)
Patient ID: Devin Ramos, male    DOB: 1966/05/07  Age: 51 y.o. MRN: [id number]    Subjective:  Subjective  HPI Devin Ramos presents for cpe   No complaints.    Review of Systems  Constitutional: Negative.  Negative for fatigue and unexpected weight change.  HENT: Negative for congestion, ear pain, hearing loss, nosebleeds, postnasal drip, rhinorrhea, sinus pressure, sneezing and tinnitus.   Eyes: Negative for photophobia, discharge, itching and visual disturbance.  Respiratory: Negative.  Negative for cough and shortness of breath.   Cardiovascular: Negative.  Negative for chest pain and palpitations.  Gastrointestinal: Negative for abdominal distention, abdominal pain, anal bleeding, blood in stool and constipation.  Endocrine: Negative.   Genitourinary: Negative.   Musculoskeletal: Negative.   Skin: Negative.   Allergic/Immunologic: Negative.   Neurological: Negative for dizziness, weakness, light-headedness, numbness and headaches.  Psychiatric/Behavioral: Negative for agitation, confusion, decreased concentration, dysphoric mood, sleep disturbance and suicidal ideas. The patient is not nervous/anxious.     History Past Medical History:  Diagnosis Date  . Allergy   . Environmental allergies   . Eosinophilic esophagitis   . Esophageal stenosis   . Hiatal hernia   . Lumbar herniated disc     He has a past surgical history that includes Appendectomy; Esophagogastroduodenoscopy (egd) with propofol (N/A, 10/09/2016); and Upper gastrointestinal endoscopy.   His family history includes Cancer in his father; Diabetes in his mother; Heart disease in his paternal grandfather; Hypertension in his brother, father, and mother; Prostate cancer in his father; Stroke in his paternal grandfather.He reports that he has quit smoking. He has never used smokeless tobacco. He reports that he does not drink alcohol or use drugs.  Current Outpatient Prescriptions on File Prior to Visit    Medication Sig Dispense Refill  . fluticasone (FLOVENT HFA) 220 MCG/ACT inhaler 2 puffs swallowed and not inhaled twice daily x 8 weeks Rinse mouth with water and swallow afterwards Do not drink anything else for 30 mins prior or after treatment 1 Inhaler 1  . omeprazole (PRILOSEC) 40 MG capsule Take 1 capsule (40 mg total) by mouth daily. 30 capsule 11   No current facility-administered medications on file prior to visit.      Objective:  Objective  Physical Exam  Constitutional: He is oriented to person, place, and time. He appears well-developed and well-nourished. No distress.  HENT:  Head: Normocephalic and atraumatic.  Right Ear: External ear normal.  Left Ear: External ear normal.  Nose: Nose normal.  Mouth/Throat: Oropharynx is clear and moist. No oropharyngeal exudate.  Eyes: Pupils are equal, round, and reactive to light. Conjunctivae and EOM are normal. Right eye exhibits no discharge. Left eye exhibits no discharge.  Neck: Normal range of motion. Neck supple. No JVD present. No thyromegaly present.  Cardiovascular: Normal rate, regular rhythm and intact distal pulses.  Exam reveals no gallop and no friction rub.   No murmur heard. Pulmonary/Chest: Effort normal and breath sounds normal. No respiratory distress. He has no wheezes. He has no rales. He exhibits no tenderness.  Abdominal: Soft. Bowel sounds are normal. He exhibits no distension and no mass. There is no tenderness. There is no rebound and no guarding.  Genitourinary: Rectum normal, prostate normal and penis normal. Rectal exam shows guaiac negative stool.  Musculoskeletal: Normal range of motion. He exhibits no edema or tenderness.  Lymphadenopathy:    He has no cervical adenopathy.  Neurological: He is alert and oriented to person, place, and time. He displays  normal reflexes. He exhibits normal muscle tone.  Skin: Skin is warm and dry. No rash noted. He is not diaphoretic. No erythema. No pallor.   Psychiatric: He has a normal mood and affect. His behavior is normal. Judgment and thought content normal.  Nursing note and vitals reviewed.  BP 118/76 (BP Location: Left Arm, Patient Position: Sitting, Cuff Size: Normal)   Pulse 71   Temp 98.2 F (36.8 C) (Oral)   Ht 6\' 4"  (1.93 m)   Wt 232 lb (105.2 kg)   SpO2 96%   BMI 28.24 kg/m  Wt Readings from Last 3 Encounters:  08/06/17 232 lb (105.2 kg)  12/24/16 227 lb 8 oz (103.2 kg)  11/24/16 221 lb (100.2 kg)     Lab Results  Component Value Date   WBC 6.4 08/06/2017   HGB 14.0 08/06/2017   HCT 40.8 08/06/2017   PLT 212.0 08/06/2017   GLUCOSE 92 08/06/2017   CHOL 160 08/06/2017   TRIG 147.0 08/06/2017   HDL 33.50 (L) 08/06/2017   LDLCALC 97 08/06/2017   ALT 17 08/06/2017   AST 6 08/06/2017   NA 141 08/06/2017   K 4.1 08/06/2017   CL 106 08/06/2017   CREATININE 0.89 08/06/2017   BUN 16 08/06/2017   CO2 29 08/06/2017   TSH 1.56 08/06/2017   PSA 0.55 08/06/2017    Dg Abd Acute W/chest  Result Date: 10/09/2016 CLINICAL DATA:  Epigastric pain with nausea and vomiting for 9 hours. Leukocytosis. EXAM: DG ABDOMEN ACUTE W/ 1V CHEST COMPARISON:  03/01/2015 FINDINGS: There is no evidence of dilated bowel loops or free intraperitoneal air. No radiopaque calculi or other significant radiographic abnormality is seen. Heart size and mediastinal contours are within normal limits. Both lungs are clear. IMPRESSION: Negative abdominal radiographs.  No acute cardiopulmonary disease. Electronically Signed   By: Devin Ramos M.D.   On: 10/09/2016 03:33     Assessment & Plan:  Plan  I am having Devin Ramos maintain his omeprazole and fluticasone.  No orders of the defined types were placed in this encounter.   Problem List Items Addressed This Visit      Unprioritized   Preventative health care - Primary    ghm utd Check labs See AVS      Relevant Orders   CBC with Differential/Platelet (Completed)   Lipid panel  (Completed)   PSA (Completed)   TSH (Completed)   Comprehensive metabolic panel (Completed)   POCT Urinalysis Dipstick (Automated)    Other Visit Diagnoses    Other fatigue       Relevant Orders   Testos,Total,Free and SHBG (Male)      Follow-up: Return in about 1 year (around 08/06/2018) for annual exam, fasting.  Ann Held, DO

## 2017-08-06 NOTE — Patient Instructions (Signed)

## 2017-08-09 NOTE — Assessment & Plan Note (Signed)
ghm utd Check labs See AVS 

## 2017-08-10 ENCOUNTER — Other Ambulatory Visit: Payer: Self-pay | Admitting: Family Medicine

## 2017-08-10 DIAGNOSIS — E785 Hyperlipidemia, unspecified: Secondary | ICD-10-CM

## 2017-08-11 LAB — TESTOS,TOTAL,FREE AND SHBG (FEMALE)
FREE TESTOSTERONE: 41.8 pg/mL (ref 35.0–155.0)
SEX HORMONE BINDING: 67 nmol/L — AB (ref 10–50)
Testosterone, Total, LC-MS-MS: 516 ng/dL (ref 250–1100)

## 2017-10-31 ENCOUNTER — Other Ambulatory Visit: Payer: Self-pay | Admitting: Gastroenterology

## 2018-01-02 ENCOUNTER — Other Ambulatory Visit: Payer: Self-pay | Admitting: Gastroenterology

## 2018-02-18 ENCOUNTER — Ambulatory Visit (INDEPENDENT_AMBULATORY_CARE_PROVIDER_SITE_OTHER): Payer: BLUE CROSS/BLUE SHIELD | Admitting: Family Medicine

## 2018-02-18 ENCOUNTER — Encounter: Payer: Self-pay | Admitting: Family Medicine

## 2018-02-18 VITALS — BP 124/76 | HR 74 | Temp 98.0°F | Resp 16 | Ht 76.0 in | Wt 232.4 lb

## 2018-02-18 DIAGNOSIS — N4889 Other specified disorders of penis: Secondary | ICD-10-CM

## 2018-02-18 NOTE — Patient Instructions (Signed)

## 2018-02-18 NOTE — Progress Notes (Signed)
Subjective:  I acted as a Education administrator for [employer]. Yancey Flemings, [place]   Patient ID: Gildardo Cranker, male    DOB: 08/25/1966, 52 y.o.   MRN: [id number]  Chief Complaint  Patient presents with  . Penis Pain    ongoing for a year    HPI  Patient is in today for unusual pain in penis and a curved erection that is painful.  Pt thinks he may have PD and is asking for a urology referral.     Patient Care Team: Carollee Herter, Alferd Apa, DO as PCP - General (Family Medicine)   Past Medical History:  Diagnosis Date  . Allergy   . Environmental allergies   . Eosinophilic esophagitis   . Esophageal stenosis   . Hiatal hernia   . Lumbar herniated disc     Past Surgical History:  Procedure Laterality Date  . APPENDECTOMY    . ESOPHAGOGASTRODUODENOSCOPY (EGD) WITH PROPOFOL N/A 10/09/2016   Procedure: ESOPHAGOGASTRODUODENOSCOPY (EGD) WITH PROPOFOL;  Surgeon: Ladene Artist, MD;  Location: [facility] ENDOSCOPY;  Service: Endoscopy;  Laterality: N/A;  . UPPER GASTROINTESTINAL ENDOSCOPY      Family History  Problem Relation Age of Onset  . Prostate cancer Father   . Hypertension Father   . Cancer Father        prostate  . Heart disease Paternal Grandfather   . Stroke Paternal Grandfather   . Hypertension Mother   . Diabetes Mother   . Hypertension Brother   . Colon cancer Neg Hx   . Esophageal cancer Neg Hx   . Rectal cancer Neg Hx   . Stomach cancer Neg Hx     Social History   Socioeconomic History  . Marital status: Married    Spouse name: Not on file  . Number of children: Not on file  . Years of education: Not on file  . Highest education level: Not on file  Occupational History  . Occupation: [employer]  Social Needs  . Financial resource strain: Not on file  . Food insecurity:    Worry: Not on file    Inability: Not on file  . Transportation needs:    Medical: Not on file    Non-medical: Not on file  Tobacco Use  . Smoking status: Former Research scientist (life sciences)  . Smokeless tobacco:  Never Used  Substance and Sexual Activity  . Alcohol use: No    Comment: rare  . Drug use: No  . Sexual activity: Yes    Partners: Female  Lifestyle  . Physical activity:    Days per week: Not on file    Minutes per session: Not on file  . Stress: Not on file  Relationships  . Social connections:    Talks on phone: Not on file    Gets together: Not on file    Attends religious service: Not on file    Active member of club or organization: Not on file    Attends meetings of clubs or organizations: Not on file    Relationship status: Not on file  . Intimate partner violence:    Fear of current or ex partner: Not on file    Emotionally abused: Not on file    Physically abused: Not on file    Forced sexual activity: Not on file  Other Topics Concern  . Not on file  Social History Narrative   Exercise-- walking ,  Physical job    Outpatient Medications Prior to Visit  Medication Sig Dispense Refill  .  fluticasone (FLOVENT HFA) 220 MCG/ACT inhaler 2 puffs swallowed and not inhaled twice daily x 8 weeks Rinse mouth with water and swallow afterwards Do not drink anything else for 30 mins prior or after treatment (Patient not taking: Reported on 02/18/2018) 1 Inhaler 1  . omeprazole (PRILOSEC) 40 MG capsule TAKE ONE CAPSULE BY MOUTH EVERY DAY 30 capsule 0   No facility-administered medications prior to visit.     Allergies  Allergen Reactions  . Latex Other (See Comments)    Eyes swell    Review of Systems  Constitutional: Negative for chills, fever and malaise/fatigue.  HENT: Negative for congestion and hearing loss.   Eyes: Negative for discharge.  Respiratory: Negative for cough, sputum production and shortness of breath.   Cardiovascular: Negative for chest pain, palpitations and leg swelling.  Gastrointestinal: Negative for abdominal pain, blood in stool, constipation, diarrhea, heartburn, nausea and vomiting.  Genitourinary: Negative for dysuria, flank pain,  frequency, hematuria and urgency.  Musculoskeletal: Negative for back pain, falls and myalgias.  Skin: Negative for rash.  Neurological: Negative for dizziness, sensory change, loss of consciousness, weakness and headaches.  Endo/Heme/Allergies: Negative for environmental allergies. Does not bruise/bleed easily.  Psychiatric/Behavioral: Negative for depression and suicidal ideas. The patient is not nervous/anxious and does not have insomnia.        Objective:    Physical Exam  Constitutional: He is oriented to person, place, and time. Vital signs are normal. He appears well-developed and well-nourished. He is sleeping.  Neck: Normal range of motion. Neck supple. No thyromegaly present.  Cardiovascular: Normal rate and regular rhythm.  No murmur heard. Pulmonary/Chest: Effort normal and breath sounds normal. No respiratory distress. He has no wheezes. He has no rales. He exhibits no tenderness.  Musculoskeletal: He exhibits no edema or tenderness.  Neurological: He is alert and oriented to person, place, and time.  Psychiatric: He has a normal mood and affect. His behavior is normal. Judgment and thought content normal.  Nursing note and vitals reviewed.   BP 124/76 (BP Location: Left Arm, Patient Position: Sitting, Cuff Size: Large)   Pulse 74   Temp 98 F (36.7 C) (Oral)   Resp 16   Ht 6\' 4"  (1.93 m)   Wt 232 lb 6.4 oz (105.4 kg)   SpO2 97%   BMI 28.29 kg/m  Wt Readings from Last 3 Encounters:  02/18/18 232 lb 6.4 oz (105.4 kg)  08/06/17 232 lb (105.2 kg)  12/24/16 227 lb 8 oz (103.2 kg)   BP Readings from Last 3 Encounters:  02/18/18 124/76  08/06/17 118/76  12/24/16 102/70     Immunization History  Administered Date(s) Administered  . Td 07/25/2004  . Tdap 06/01/2014    Health Maintenance  Topic Date Due  . HIV Screening  05/03/1983  . INFLUENZA VACCINE  08/06/2018 (Originally 06/03/2018)  . TETANUS/TDAP  06/01/2024    Lab Results  Component Value Date   WBC  6.4 08/06/2017   HGB 14.0 08/06/2017   HCT 40.8 08/06/2017   PLT 212.0 08/06/2017   GLUCOSE 92 08/06/2017   CHOL 160 08/06/2017   TRIG 147.0 08/06/2017   HDL 33.50 (L) 08/06/2017   LDLCALC 97 08/06/2017   ALT 17 08/06/2017   AST 6 08/06/2017   NA 141 08/06/2017   K 4.1 08/06/2017   CL 106 08/06/2017   CREATININE 0.89 08/06/2017   BUN 16 08/06/2017   CO2 29 08/06/2017   TSH 1.56 08/06/2017   PSA 0.55 08/06/2017  Lab Results  Component Value Date   TSH 1.56 08/06/2017   Lab Results  Component Value Date   WBC 6.4 08/06/2017   HGB 14.0 08/06/2017   HCT 40.8 08/06/2017   MCV 90.2 08/06/2017   PLT 212.0 08/06/2017   Lab Results  Component Value Date   NA 141 08/06/2017   K 4.1 08/06/2017   CO2 29 08/06/2017   GLUCOSE 92 08/06/2017   BUN 16 08/06/2017   CREATININE 0.89 08/06/2017   BILITOT 0.6 08/06/2017   ALKPHOS 61 08/06/2017   AST 6 08/06/2017   ALT 17 08/06/2017   PROT 6.7 08/06/2017   ALBUMIN 4.4 08/06/2017   CALCIUM 9.2 08/06/2017   ANIONGAP 9 10/09/2016   GFR 96.46 08/06/2017   Lab Results  Component Value Date   CHOL 160 08/06/2017   Lab Results  Component Value Date   HDL 33.50 (L) 08/06/2017   Lab Results  Component Value Date   LDLCALC 97 08/06/2017   Lab Results  Component Value Date   TRIG 147.0 08/06/2017   Lab Results  Component Value Date   CHOLHDL 5 08/06/2017   No results found for: HGBA1C       Assessment & Plan:   Problem List Items Addressed This Visit    None    Visit Diagnoses    Penile pain    -  Primary   Relevant Orders   Ambulatory referral to Urology    pt concerned about peyronies disease   I am having Tobie Lords. Mazzoni maintain his fluticasone and omeprazole.  No orders of the defined types were placed in this encounter.   CMA served as Education administrator during this visit. History, Physical and Plan performed by medical provider. Documentation and orders reviewed and attested to.  Ann Held, DO

## 2018-07-04 ENCOUNTER — Other Ambulatory Visit: Payer: Self-pay | Admitting: Gastroenterology

## 2018-08-12 ENCOUNTER — Ambulatory Visit (INDEPENDENT_AMBULATORY_CARE_PROVIDER_SITE_OTHER): Payer: BLUE CROSS/BLUE SHIELD | Admitting: Family Medicine

## 2018-08-12 DIAGNOSIS — Z0000 Encounter for general adult medical examination without abnormal findings: Secondary | ICD-10-CM

## 2018-08-26 ENCOUNTER — Encounter: Payer: Self-pay | Admitting: Family Medicine

## 2018-08-26 ENCOUNTER — Ambulatory Visit (INDEPENDENT_AMBULATORY_CARE_PROVIDER_SITE_OTHER): Payer: BLUE CROSS/BLUE SHIELD | Admitting: Family Medicine

## 2018-08-26 VITALS — BP 112/70 | HR 83 | Temp 98.3°F | Resp 16 | Ht 76.0 in | Wt 228.6 lb

## 2018-08-26 DIAGNOSIS — Z0000 Encounter for general adult medical examination without abnormal findings: Secondary | ICD-10-CM | POA: Diagnosis not present

## 2018-08-26 DIAGNOSIS — Z125 Encounter for screening for malignant neoplasm of prostate: Secondary | ICD-10-CM | POA: Diagnosis not present

## 2018-08-26 DIAGNOSIS — Z23 Encounter for immunization: Secondary | ICD-10-CM

## 2018-08-26 LAB — LIPID PANEL
CHOLESTEROL: 152 mg/dL (ref 0–200)
HDL: 31.7 mg/dL — ABNORMAL LOW (ref >39.00)
LDL Cholesterol: 94 mg/dL (ref 0–99)
NonHDL: 120.28
TRIGLYCERIDES: 131 mg/dL (ref 0.0–149.0)
Total CHOL/HDL Ratio: 5
VLDL: 26.2 mg/dL (ref 0.0–40.0)

## 2018-08-26 LAB — CBC WITH DIFFERENTIAL/PLATELET
BASOS PCT: 1.9 % (ref 0.0–3.0)
Basophils Absolute: 0.1 10*3/uL (ref 0.0–0.1)
EOS ABS: 0.2 10*3/uL (ref 0.0–0.7)
EOS PCT: 3.5 % (ref 0.0–5.0)
HEMATOCRIT: 40 % (ref 39.0–52.0)
HEMOGLOBIN: 14 g/dL (ref 13.0–17.0)
Lymphocytes Relative: 23.8 % (ref 12.0–46.0)
Lymphs Abs: 1.5 10*3/uL (ref 0.7–4.0)
MCHC: 35.1 g/dL (ref 30.0–36.0)
MCV: 89.8 fl (ref 78.0–100.0)
MONO ABS: 0.4 10*3/uL (ref 0.1–1.0)
Monocytes Relative: 6 % (ref 3.0–12.0)
Neutro Abs: 4 10*3/uL (ref 1.4–7.7)
Neutrophils Relative %: 64.8 % (ref 43.0–77.0)
Platelets: 218 10*3/uL (ref 150.0–400.0)
RBC: 4.45 Mil/uL (ref 4.22–5.81)
RDW: 13 % (ref 11.5–15.5)
WBC: 6.1 10*3/uL (ref 4.0–10.5)

## 2018-08-26 LAB — PSA: PSA: 0.55 ng/mL (ref 0.10–4.00)

## 2018-08-26 LAB — COMPREHENSIVE METABOLIC PANEL
ALBUMIN: 4.5 g/dL (ref 3.5–5.2)
ALK PHOS: 65 U/L (ref 39–117)
ALT: 16 U/L (ref 0–53)
AST: 5 U/L (ref 0–37)
BILIRUBIN TOTAL: 0.7 mg/dL (ref 0.2–1.2)
BUN: 13 mg/dL (ref 6–23)
CO2: 29 mEq/L (ref 19–32)
CREATININE: 0.86 mg/dL (ref 0.40–1.50)
Calcium: 9.1 mg/dL (ref 8.4–10.5)
Chloride: 106 mEq/L (ref 96–112)
GFR: 99.92 mL/min (ref >60.00)
Glucose, Bld: 93 mg/dL (ref 70–99)
Potassium: 4.1 mEq/L (ref 3.5–5.1)
SODIUM: 142 meq/L (ref 135–145)
TOTAL PROTEIN: 6.7 g/dL (ref 6.0–8.3)

## 2018-08-26 LAB — TSH: TSH: 1.85 u[IU]/mL (ref 0.35–4.50)

## 2018-08-26 NOTE — Patient Instructions (Signed)

## 2018-08-26 NOTE — Progress Notes (Signed)
Patient ID: Devin Ramos, male    DOB: 31-Oct-1966  Age: 52 y.o. MRN: [id number]    Subjective:  Subjective  HPI PEARL BERLINGER presents for cpe   No complaints  Review of Systems  Constitutional: Negative for chills and fever.  HENT: Negative for congestion and hearing loss.   Eyes: Negative for discharge.  Respiratory: Negative for cough and shortness of breath.   Cardiovascular: Negative for chest pain, palpitations and leg swelling.  Gastrointestinal: Negative for abdominal pain, blood in stool, constipation, diarrhea, nausea and vomiting.  Genitourinary: Negative for dysuria, frequency, hematuria and urgency.  Musculoskeletal: Negative for back pain and myalgias.  Skin: Negative for rash.  Allergic/Immunologic: Negative for environmental allergies.  Neurological: Negative for dizziness, weakness and headaches.  Hematological: Does not bruise/bleed easily.  Psychiatric/Behavioral: Negative for suicidal ideas. The patient is not nervous/anxious.     History Past Medical History:  Diagnosis Date  . Allergy   . Environmental allergies   . Eosinophilic esophagitis   . Esophageal stenosis   . Hiatal hernia   . Lumbar herniated disc     He has a past surgical history that includes Appendectomy; Esophagogastroduodenoscopy (egd) with propofol (N/A, 10/09/2016); and Upper gastrointestinal endoscopy.   His family history includes Cancer in his father; Diabetes in his mother; Heart disease in his paternal grandfather; Hypertension in his brother, father, and mother; Prostate cancer in his father; Stroke in his paternal grandfather.He reports that he has quit smoking. He has never used smokeless tobacco. He reports that he does not drink alcohol or use drugs.  Current Outpatient Medications on File Prior to Visit  Medication Sig Dispense Refill  . omeprazole (PRILOSEC OTC) 20 MG tablet Take 20 mg by mouth daily.     No current facility-administered medications on file prior to  visit.      Objective:  Objective  Physical Exam  Constitutional: He is oriented to person, place, and time. He appears well-developed and well-nourished. No distress.  HENT:  Head: Normocephalic and atraumatic.  Right Ear: External ear normal.  Left Ear: External ear normal.  Nose: Nose normal.  Mouth/Throat: Oropharynx is clear and moist. No oropharyngeal exudate.  Eyes: Pupils are equal, round, and reactive to light. Conjunctivae and EOM are normal. Right eye exhibits no discharge. Left eye exhibits no discharge.  Neck: Normal range of motion. Neck supple. No JVD present. No thyromegaly present.  Cardiovascular: Normal rate, regular rhythm and intact distal pulses.  No murmur heard. Pulmonary/Chest: Effort normal and breath sounds normal. No respiratory distress. He has no wheezes. He has no rales. He exhibits no tenderness.  Abdominal: Soft. Bowel sounds are normal. He exhibits no distension and no mass. There is no tenderness. There is no rebound and no guarding.  Genitourinary: Prostate normal and penis normal. Rectal exam shows guaiac negative stool. No penile tenderness.  Musculoskeletal: Normal range of motion. He exhibits no edema or tenderness.  Lymphadenopathy:    He has no cervical adenopathy.  Neurological: He is alert and oriented to person, place, and time. He has normal reflexes. He displays normal reflexes. No cranial nerve deficit. He exhibits normal muscle tone.  Skin: Skin is warm and dry. No rash noted. He is not diaphoretic. No erythema.  Psychiatric: He has a normal mood and affect. His behavior is normal. Judgment and thought content normal.  Nursing note and vitals reviewed.  BP 112/70 (BP Location: Right Arm, Cuff Size: Large)   Pulse 83   Temp 98.3 F (36.8  C) (Oral)   Resp 16   Ht 6\' 4"  (1.93 m)   Wt 228 lb 9.6 oz (103.7 kg)   SpO2 97%   BMI 27.83 kg/m  Wt Readings from Last 3 Encounters:  08/26/18 228 lb 9.6 oz (103.7 kg)  02/18/18 232 lb 6.4 oz  (105.4 kg)  08/06/17 232 lb (105.2 kg)     Lab Results  Component Value Date   WBC 6.4 08/06/2017   HGB 14.0 08/06/2017   HCT 40.8 08/06/2017   PLT 212.0 08/06/2017   GLUCOSE 92 08/06/2017   CHOL 160 08/06/2017   TRIG 147.0 08/06/2017   HDL 33.50 (L) 08/06/2017   LDLCALC 97 08/06/2017   ALT 17 08/06/2017   AST 6 08/06/2017   NA 141 08/06/2017   K 4.1 08/06/2017   CL 106 08/06/2017   CREATININE 0.89 08/06/2017   BUN 16 08/06/2017   CO2 29 08/06/2017   TSH 1.56 08/06/2017   PSA 0.55 08/06/2017    Dg Abd Acute W/chest  Result Date: 10/09/2016 CLINICAL DATA:  Epigastric pain with nausea and vomiting for 9 hours. Leukocytosis. EXAM: DG ABDOMEN ACUTE W/ 1V CHEST COMPARISON:  03/01/2015 FINDINGS: There is no evidence of dilated bowel loops or free intraperitoneal air. No radiopaque calculi or other significant radiographic abnormality is seen. Heart size and mediastinal contours are within normal limits. Both lungs are clear. IMPRESSION: Negative abdominal radiographs.  No acute cardiopulmonary disease. Electronically Signed   By: Andreas Newport M.D.   On: 10/09/2016 03:33     Assessment & Plan:  Plan  I have discontinued Macio Kissoon. Welles's fluticasone. I am also having him maintain his omeprazole.  No orders of the defined types were placed in this encounter.   Problem List Items Addressed This Visit      Unprioritized   Preventative health care - Primary   Relevant Orders   CBC with Differential/Platelet   Comprehensive metabolic panel   Lipid panel   TSH   PSA   HIV Antibody (routine testing w rflx)   Ambulatory referral to Gastroenterology    Other Visit Diagnoses    Need for shingles vaccine       Relevant Orders   Varicella-zoster vaccine IM (Shingrix) (Completed)     ghm utd Check labs shingrix #1 given rto 2 months for #2   Follow-up: Return in about 1 year (around 08/27/2019), or if symptoms worsen or fail to improve, for annual exam,  fasting.  Ann Held, DO

## 2018-08-27 ENCOUNTER — Encounter: Payer: Self-pay | Admitting: *Deleted

## 2018-08-27 LAB — HIV ANTIBODY (ROUTINE TESTING W REFLEX): HIV 1&2 Ab, 4th Generation: NONREACTIVE

## 2018-09-28 ENCOUNTER — Other Ambulatory Visit: Payer: Self-pay | Admitting: Family Medicine

## 2018-09-28 DIAGNOSIS — R0683 Snoring: Secondary | ICD-10-CM

## 2018-09-28 DIAGNOSIS — Z0000 Encounter for general adult medical examination without abnormal findings: Secondary | ICD-10-CM

## 2018-12-30 ENCOUNTER — Ambulatory Visit (INDEPENDENT_AMBULATORY_CARE_PROVIDER_SITE_OTHER): Payer: BLUE CROSS/BLUE SHIELD | Admitting: *Deleted

## 2018-12-30 DIAGNOSIS — Z23 Encounter for immunization: Secondary | ICD-10-CM

## 2018-12-30 NOTE — Progress Notes (Signed)
Patient her for 2nd shingles vaccine.  Vaccine given and patient tolerated well.

## 2019-09-14 ENCOUNTER — Other Ambulatory Visit: Payer: Self-pay

## 2019-09-15 ENCOUNTER — Encounter: Payer: Self-pay | Admitting: Family Medicine

## 2019-09-15 ENCOUNTER — Other Ambulatory Visit: Payer: Self-pay

## 2019-09-15 ENCOUNTER — Ambulatory Visit (INDEPENDENT_AMBULATORY_CARE_PROVIDER_SITE_OTHER): Payer: BC Managed Care – PPO | Admitting: Family Medicine

## 2019-09-15 VITALS — BP 118/60 | HR 79 | Temp 98.0°F | Resp 18 | Ht 76.0 in | Wt 228.6 lb

## 2019-09-15 DIAGNOSIS — L309 Dermatitis, unspecified: Secondary | ICD-10-CM | POA: Diagnosis not present

## 2019-09-15 DIAGNOSIS — R0683 Snoring: Secondary | ICD-10-CM

## 2019-09-15 MED ORDER — BETAMETHASONE DIPROPIONATE AUG 0.05 % EX CREA: TOPICAL_CREAM | Freq: Two times a day (BID) | CUTANEOUS | 0 refills | Status: DC

## 2019-09-15 NOTE — Patient Instructions (Signed)

## 2019-09-15 NOTE — Progress Notes (Signed)
Patient ID: Devin Ramos, male    DOB: September 23, 1966  Age: 53 y.o. MRN: [id number]    Subjective:  Subjective  HPI Devin Ramos presents for dry cracked skin on his hands -- he works on cars all day    He also needs to reschedule his sleep study  Review of Systems  Constitutional: Negative for appetite change, diaphoresis, fatigue and unexpected weight change.  Eyes: Negative for pain, redness and visual disturbance.  Respiratory: Negative for cough, chest tightness, shortness of breath and wheezing.   Cardiovascular: Negative for chest pain, palpitations and leg swelling.  Endocrine: Negative for cold intolerance, heat intolerance, polydipsia, polyphagia and polyuria.  Genitourinary: Negative for difficulty urinating, dysuria and frequency.  Skin: Positive for color change and rash.  Neurological: Negative for dizziness, light-headedness, numbness and headaches.    History Past Medical History:  Diagnosis Date  . Allergy   . Environmental allergies   . Eosinophilic esophagitis   . Esophageal stenosis   . Hiatal hernia   . Lumbar herniated disc     He has a past surgical history that includes Appendectomy; Esophagogastroduodenoscopy (egd) with propofol (N/A, 10/09/2016); and Upper gastrointestinal endoscopy.   His family history includes Cancer in his father; Diabetes in his mother; Heart disease in his paternal grandfather; Hypertension in his brother, father, and mother; Prostate cancer in his father; Stroke in his paternal grandfather.He reports that he has quit smoking. He has never used smokeless tobacco. He reports that he does not drink alcohol or use drugs.  Current Outpatient Medications on File Prior to Visit  Medication Sig Dispense Refill  . cetirizine (ZYRTEC) 10 MG tablet Take 10 mg by mouth daily.    Marland Kitchen loratadine (CLARITIN) 10 MG tablet Take by mouth.    Marland Kitchen omeprazole (PRILOSEC OTC) 20 MG tablet Take 20 mg by mouth daily.     No current  facility-administered medications on file prior to visit.      Objective:  Objective  Physical Exam Vitals signs and nursing note reviewed.  Constitutional:      General: He is sleeping.     Appearance: He is well-developed.  HENT:     Head: Normocephalic and atraumatic.  Eyes:     Pupils: Pupils are equal, round, and reactive to light.  Neck:     Musculoskeletal: Normal range of motion and neck supple.     Thyroid: No thyromegaly.  Cardiovascular:     Rate and Rhythm: Normal rate and regular rhythm.     Heart sounds: No murmur.  Pulmonary:     Effort: Pulmonary effort is normal. No respiratory distress.     Breath sounds: Normal breath sounds. No wheezing or rales.  Chest:     Chest wall: No tenderness.  Musculoskeletal:        General: No tenderness.  Skin:    General: Skin is warm and dry.     Findings: Erythema and lesion present.       Neurological:     Mental Status: He is oriented to person, place, and time.  Psychiatric:        Behavior: Behavior normal.        Thought Content: Thought content normal.        Judgment: Judgment normal.    BP 118/60 (BP Location: Right Arm, Patient Position: Sitting, Cuff Size: Normal)   Pulse 79   Temp 98 F (36.7 C) (Temporal)   Resp 18   Ht 6\' 4"  (1.93 m)   Wt  228 lb 9.6 oz (103.7 kg)   SpO2 97%   BMI 27.83 kg/m  Wt Readings from Last 3 Encounters:  09/15/19 228 lb 9.6 oz (103.7 kg)  08/26/18 228 lb 9.6 oz (103.7 kg)  02/18/18 232 lb 6.4 oz (105.4 kg)     Lab Results  Component Value Date   WBC 6.1 08/26/2018   HGB 14.0 08/26/2018   HCT 40.0 08/26/2018   PLT 218.0 08/26/2018   GLUCOSE 93 08/26/2018   CHOL 152 08/26/2018   TRIG 131.0 08/26/2018   HDL 31.70 (L) 08/26/2018   LDLCALC 94 08/26/2018   ALT 16 08/26/2018   AST 5 08/26/2018   NA 142 08/26/2018   K 4.1 08/26/2018   CL 106 08/26/2018   CREATININE 0.86 08/26/2018   BUN 13 08/26/2018   CO2 29 08/26/2018   TSH 1.85 08/26/2018   PSA 0.55  08/26/2018    Dg Abd Acute W/chest  Result Date: 10/09/2016 CLINICAL DATA:  Epigastric pain with nausea and vomiting for 9 hours. Leukocytosis. EXAM: DG ABDOMEN ACUTE W/ 1V CHEST COMPARISON:  03/01/2015 FINDINGS: There is no evidence of dilated bowel loops or free intraperitoneal air. No radiopaque calculi or other significant radiographic abnormality is seen. Heart size and mediastinal contours are within normal limits. Both lungs are clear. IMPRESSION: Negative abdominal radiographs.  No acute cardiopulmonary disease. Electronically Signed   By: Andreas Newport M.D.   On: 10/09/2016 03:33     Assessment & Plan:  Plan  I am having Devin Ramos. Devin Ramos start on augmented betamethasone dipropionate. I am also having him maintain his omeprazole, loratadine, and cetirizine.  Meds ordered this encounter  Medications  . augmented betamethasone dipropionate (DIPROLENE AF) 0.05 % cream    Sig: Apply topically 2 (two) times daily.    Dispense:  30 g    Refill:  0    Problem List Items Addressed This Visit    None    Visit Diagnoses    Eczema, unspecified type    -  Primary   Relevant Medications   augmented betamethasone dipropionate (DIPROLENE AF) 0.05 % cream   Snoring       Relevant Orders   Ambulatory referral to Neurology      Follow-up: Return if symptoms worsen or fail to improve.  Ann Held, DO

## 2019-09-22 ENCOUNTER — Other Ambulatory Visit: Payer: Self-pay

## 2019-09-22 ENCOUNTER — Encounter: Payer: Self-pay | Admitting: Family Medicine

## 2019-09-22 ENCOUNTER — Ambulatory Visit (INDEPENDENT_AMBULATORY_CARE_PROVIDER_SITE_OTHER): Payer: BC Managed Care – PPO | Admitting: Family Medicine

## 2019-09-22 VITALS — BP 117/66 | HR 73 | Temp 97.5°F | Resp 16 | Ht 74.0 in | Wt 226.0 lb

## 2019-09-22 DIAGNOSIS — Z0000 Encounter for general adult medical examination without abnormal findings: Secondary | ICD-10-CM

## 2019-09-22 DIAGNOSIS — Z1211 Encounter for screening for malignant neoplasm of colon: Secondary | ICD-10-CM | POA: Diagnosis not present

## 2019-09-22 DIAGNOSIS — Z125 Encounter for screening for malignant neoplasm of prostate: Secondary | ICD-10-CM

## 2019-09-22 LAB — LIPID PANEL
Cholesterol: 153 mg/dL (ref 0–200)
HDL: 31.4 mg/dL — ABNORMAL LOW (ref >39.00)
LDL Cholesterol: 96 mg/dL (ref 0–99)
NonHDL: 121.55
Total CHOL/HDL Ratio: 5
Triglycerides: 127 mg/dL (ref 0.0–149.0)
VLDL: 25.4 mg/dL (ref 0.0–40.0)

## 2019-09-22 LAB — COMPREHENSIVE METABOLIC PANEL
ALT: 18 U/L (ref 0–53)
AST: 7 U/L (ref 0–37)
Albumin: 4.3 g/dL (ref 3.5–5.2)
Alkaline Phosphatase: 58 U/L (ref 39–117)
BUN: 15 mg/dL (ref 6–23)
CO2: 27 mEq/L (ref 19–32)
Calcium: 8.8 mg/dL (ref 8.4–10.5)
Chloride: 107 mEq/L (ref 96–112)
Creatinine, Ser: 0.83 mg/dL (ref 0.40–1.50)
GFR: 97.52 mL/min (ref >60.00)
Glucose, Bld: 100 mg/dL — ABNORMAL HIGH (ref 70–99)
Potassium: 4.3 mEq/L (ref 3.5–5.1)
Sodium: 142 mEq/L (ref 135–145)
Total Bilirubin: 0.6 mg/dL (ref 0.2–1.2)
Total Protein: 6.3 g/dL (ref 6.0–8.3)

## 2019-09-22 LAB — CBC WITH DIFFERENTIAL/PLATELET
Basophils Absolute: 0.1 10*3/uL (ref 0.0–0.1)
Basophils Relative: 1.4 % (ref 0.0–3.0)
Eosinophils Absolute: 0.4 10*3/uL (ref 0.0–0.7)
Eosinophils Relative: 5.3 % — ABNORMAL HIGH (ref 0.0–5.0)
HCT: 39.2 % (ref 39.0–52.0)
Hemoglobin: 13.7 g/dL (ref 13.0–17.0)
Lymphocytes Relative: 20.3 % (ref 12.0–46.0)
Lymphs Abs: 1.4 10*3/uL (ref 0.7–4.0)
MCHC: 35 g/dL (ref 30.0–36.0)
MCV: 89 fl (ref 78.0–100.0)
Monocytes Absolute: 0.4 10*3/uL (ref 0.1–1.0)
Monocytes Relative: 5.9 % (ref 3.0–12.0)
Neutro Abs: 4.5 10*3/uL (ref 1.4–7.7)
Neutrophils Relative %: 67.1 % (ref 43.0–77.0)
Platelets: 213 10*3/uL (ref 150.0–400.0)
RBC: 4.4 Mil/uL (ref 4.22–5.81)
RDW: 12.7 % (ref 11.5–15.5)
WBC: 6.7 10*3/uL (ref 4.0–10.5)

## 2019-09-22 LAB — PSA: PSA: 0.5 ng/mL (ref 0.10–4.00)

## 2019-09-22 LAB — TSH: TSH: 1.72 u[IU]/mL (ref 0.35–4.50)

## 2019-09-22 NOTE — Patient Instructions (Signed)
Preventive Care 40-51 Years Old, Male Old, Male Preventive care refers to lifestyle choices and visits with your health care provider that can promote health and wellness. This includes:  A yearly physical exam. This is also called an annual well check.  Regular dental and eye exams.  Immunizations.  Screening for certain conditions.  Healthy lifestyle choices, such as eating a healthy diet, getting regular exercise, not using drugs or products that contain nicotine and tobacco, and limiting alcohol use. What can I expect for my preventive care visit? Physical exam Your health care provider will check:  Height and weight. These may be used to calculate body mass index (BMI), which is a measurement that tells if you are at a healthy weight.  Heart rate and blood pressure.  Your skin for abnormal spots. Counseling Your health care provider may ask you questions about:  Alcohol, tobacco, and drug use.  Emotional well-being.  Home and relationship well-being.  Sexual activity.  Eating habits.  Work and work environment. What immunizations do I need?  Influenza (flu) vaccine  This is recommended every year. Tetanus, diphtheria, and pertussis (Tdap) vaccine  You may need a Td booster every 10 years. Varicella (chickenpox) vaccine  You may need this vaccine if you have not already been vaccinated. Zoster (shingles) vaccine  You may need this after age 60. Measles, mumps, and rubella (MMR) vaccine  You may need at least one dose of MMR if you were born in 1957 or later. You may also need a second dose. Pneumococcal conjugate (PCV13) vaccine  You may need this if you have certain conditions and were not previously vaccinated. Pneumococcal polysaccharide (PPSV23) vaccine  You may need one or two doses if you smoke cigarettes or if you have certain conditions. Meningococcal conjugate (MenACWY) vaccine  You may need this if you have certain conditions. Hepatitis A vaccine   You may need this if you have certain conditions or if you travel or work in places where you may be exposed to hepatitis A. Hepatitis B vaccine  You may need this if you have certain conditions or if you travel or work in places where you may be exposed to hepatitis B. Haemophilus influenzae type b (Hib) vaccine  You may need this if you have certain risk factors. Human papillomavirus (HPV) vaccine  If recommended by your health care provider, you may need three doses over 6 months. You may receive vaccines as individual doses or as more than one vaccine together in one shot (combination vaccines). Talk with your health care provider about the risks and benefits of combination vaccines. What tests do I need? Blood tests  Lipid and cholesterol levels. These may be checked every 5 years, or more frequently if you are over 51 years old.  Hepatitis C test.  Hepatitis B test. Screening  Lung cancer screening. You may have this screening every year starting at age 51 if you have a 30-pack-year history of smoking and currently smoke or have quit within the past 15 years.  Prostate cancer screening. Recommendations will vary depending on your family history and other risks.  Colorectal cancer screening. All adults should have this screening starting at age 51 and continuing until age 75. Your health care provider may recommend screening at age 45 if you are at increased risk. You will have tests every 1-10 years, depending on your results and the type of screening test.  Diabetes screening. This is done by checking your blood sugar (glucose) after you have not eaten   for a while (fasting). You may have this done every 1-3 years.  Sexually transmitted disease (STD) testing. Follow these instructions at home: Eating and drinking  Eat a diet that includes fresh fruits and vegetables, whole grains, lean protein, and low-fat dairy products.  Take vitamin and mineral supplements as recommended  by your health care provider.  Do not drink alcohol if your health care provider tells you not to drink.  If you drink alcohol: ? Limit how much you have to 0-2 drinks a day. ? Be aware of how much alcohol is in your drink. In the [place] one drink equals one 12 oz bottle of beer (355 mL), one 5 oz glass of wine (148 mL), or one 1 oz glass of hard liquor (44 mL). Lifestyle  Take daily care of your teeth and gums.  Stay active. Exercise for at least 30 minutes on 5 or more days each week.  Do not use any products that contain nicotine or tobacco, such as cigarettes, e-cigarettes, and chewing tobacco. If you need help quitting, ask your health care provider.  If you are sexually active, practice safe sex. Use a condom or other form of protection to prevent STIs (sexually transmitted infections).  Talk with your health care provider about taking a low-dose aspirin every day starting at age 51. What's next?  Go to your health care provider once a year for a well check visit.  Ask your health care provider how often you should have your eyes and teeth checked.  Stay up to date on all vaccines. This information is not intended to replace advice given to you by your health care provider. Make sure you discuss any questions you have with your health care provider. Document Released: 11/16/2015 Document Revised: 10/14/2018 Document Reviewed: 10/14/2018 Elsevier Patient Education  2020 [employer].

## 2019-09-22 NOTE — Progress Notes (Signed)
Patient ID: Devin Ramos, male    DOB: 1966/01/31  Age: 53 y.o. MRN: [id number]    Subjective:  Subjective  HPI ADNAN GECK presents for cpe and labs No complaints  Review of Systems  Constitutional: Negative.  Negative for chills, fatigue, fever and unexpected weight change.  HENT: Negative for congestion, ear pain, hearing loss, nosebleeds, postnasal drip, rhinorrhea, sinus pressure, sneezing and tinnitus.   Eyes: Negative for photophobia, discharge, itching and visual disturbance.  Respiratory: Negative.  Negative for cough and shortness of breath.   Cardiovascular: Negative.  Negative for chest pain, palpitations and leg swelling.  Gastrointestinal: Negative for abdominal distention, abdominal pain, anal bleeding, blood in stool, constipation, diarrhea, nausea and vomiting.  Endocrine: Negative.   Genitourinary: Negative.  Negative for dysuria, frequency, hematuria and urgency.  Musculoskeletal: Negative.  Negative for back pain and myalgias.  Skin: Negative.  Negative for rash.  Allergic/Immunologic: Negative.  Negative for environmental allergies.  Neurological: Negative for dizziness, weakness, light-headedness, numbness and headaches.  Hematological: Does not bruise/bleed easily.  Psychiatric/Behavioral: Negative for agitation, confusion, decreased concentration, dysphoric mood, sleep disturbance and suicidal ideas. The patient is not nervous/anxious.     History Past Medical History:  Diagnosis Date  . Allergy   . Environmental allergies   . Eosinophilic esophagitis   . Esophageal stenosis   . Hiatal hernia   . Lumbar herniated disc     He has a past surgical history that includes Appendectomy; Esophagogastroduodenoscopy (egd) with propofol (N/A, 10/09/2016); and Upper gastrointestinal endoscopy.   His family history includes Cancer in his father; Diabetes in his mother; Heart disease in his paternal grandfather; Hypertension in his brother, father, and  mother; Prostate cancer in his father; Stroke in his paternal grandfather.He reports that he has never smoked. He has never used smokeless tobacco. He reports that he does not drink alcohol or use drugs.  Current Outpatient Medications on File Prior to Visit  Medication Sig Dispense Refill  . augmented betamethasone dipropionate (DIPROLENE AF) 0.05 % cream Apply topically 2 (two) times daily. 30 g 0  . cetirizine (ZYRTEC) 10 MG tablet Take 10 mg by mouth daily.    Marland Kitchen loratadine (CLARITIN) 10 MG tablet Take by mouth.    Marland Kitchen omeprazole (PRILOSEC OTC) 20 MG tablet Take 20 mg by mouth daily.     No current facility-administered medications on file prior to visit.      Objective:  Objective  Physical Exam Vitals signs and nursing note reviewed.  Constitutional:      General: He is not in acute distress.    Appearance: He is well-developed. He is not diaphoretic.  HENT:     Head: Normocephalic and atraumatic.     Right Ear: External ear normal.     Left Ear: External ear normal.     Nose: Nose normal.     Mouth/Throat:     Pharynx: No oropharyngeal exudate.  Eyes:     General:        Right eye: No discharge.        Left eye: No discharge.     Conjunctiva/sclera: Conjunctivae normal.     Pupils: Pupils are equal, round, and reactive to light.  Neck:     Musculoskeletal: Normal range of motion and neck supple.     Thyroid: No thyromegaly.     Vascular: No JVD.  Cardiovascular:     Rate and Rhythm: Normal rate and regular rhythm.     Heart sounds: No murmur. No  friction rub. No gallop.   Pulmonary:     Effort: Pulmonary effort is normal. No respiratory distress.     Breath sounds: Normal breath sounds. No wheezing or rales.  Chest:     Chest wall: No tenderness.  Abdominal:     General: Bowel sounds are normal. There is no distension.     Palpations: Abdomen is soft. There is no mass.     Tenderness: There is no abdominal tenderness. There is no guarding or rebound.   Genitourinary:    Comments: Deferred-- pt preference Musculoskeletal: Normal range of motion.        General: No tenderness.  Lymphadenopathy:     Cervical: No cervical adenopathy.  Skin:    General: Skin is warm and dry.     Coloration: Skin is not pale.     Findings: No erythema or rash.  Neurological:     Mental Status: He is alert and oriented to person, place, and time.     Motor: No abnormal muscle tone.     Deep Tendon Reflexes: Reflexes are normal and symmetric. Reflexes normal.  Psychiatric:        Behavior: Behavior normal.        Thought Content: Thought content normal.        Judgment: Judgment normal.    BP 117/66 (BP Location: Left Arm, Patient Position: Sitting, Cuff Size: Large)   Pulse 73   Temp (!) 97.5 F (36.4 C) (Temporal)   Resp 16   Ht 6\' 2"  (1.88 m)   Wt 226 lb (102.5 kg)   SpO2 100%   BMI 29.02 kg/m  Wt Readings from Last 3 Encounters:  09/22/19 226 lb (102.5 kg)  09/15/19 228 lb 9.6 oz (103.7 kg)  08/26/18 228 lb 9.6 oz (103.7 kg)     Lab Results  Component Value Date   WBC 6.1 08/26/2018   HGB 14.0 08/26/2018   HCT 40.0 08/26/2018   PLT 218.0 08/26/2018   GLUCOSE 93 08/26/2018   CHOL 152 08/26/2018   TRIG 131.0 08/26/2018   HDL 31.70 (L) 08/26/2018   LDLCALC 94 08/26/2018   ALT 16 08/26/2018   AST 5 08/26/2018   NA 142 08/26/2018   K 4.1 08/26/2018   CL 106 08/26/2018   CREATININE 0.86 08/26/2018   BUN 13 08/26/2018   CO2 29 08/26/2018   TSH 1.85 08/26/2018   PSA 0.55 08/26/2018    Dg Abd Acute W/chest  Result Date: 10/09/2016 CLINICAL DATA:  Epigastric pain with nausea and vomiting for 9 hours. Leukocytosis. EXAM: DG ABDOMEN ACUTE W/ 1V CHEST COMPARISON:  03/01/2015 FINDINGS: There is no evidence of dilated bowel loops or free intraperitoneal air. No radiopaque calculi or other significant radiographic abnormality is seen. Heart size and mediastinal contours are within normal limits. Both lungs are clear. IMPRESSION: Negative  abdominal radiographs.  No acute cardiopulmonary disease. Electronically Signed   By: Andreas Newport M.D.   On: 10/09/2016 03:33     Assessment & Plan:  Plan  I am having Samiul Acrey. [place] maintain his omeprazole, loratadine, cetirizine, and augmented betamethasone dipropionate.  No orders of the defined types were placed in this encounter.   Problem List Items Addressed This Visit      Unprioritized   Preventative health care    ghm utd Check labs See AVS      Relevant Orders   Lipid panel   CBC with Differential   TSH   PSA   Comprehensive metabolic panel  Other Visit Diagnoses    Colon cancer screening    -  Primary   Relevant Orders   Ambulatory referral to Gastroenterology      Follow-up: Return in about 1 year (around 09/21/2020) for annual exam, fasting.  Ann Held, DO

## 2019-09-22 NOTE — Assessment & Plan Note (Signed)
ghm utd Check labs See AVS 

## 2019-10-13 ENCOUNTER — Ambulatory Visit (INDEPENDENT_AMBULATORY_CARE_PROVIDER_SITE_OTHER): Payer: BC Managed Care – PPO | Admitting: Neurology

## 2019-10-13 ENCOUNTER — Encounter: Payer: Self-pay | Admitting: Neurology

## 2019-10-13 ENCOUNTER — Other Ambulatory Visit: Payer: Self-pay

## 2019-10-13 VITALS — BP 122/84 | HR 70 | Temp 97.9°F | Ht 74.0 in | Wt 227.0 lb

## 2019-10-13 DIAGNOSIS — G478 Other sleep disorders: Secondary | ICD-10-CM

## 2019-10-13 DIAGNOSIS — R1310 Dysphagia, unspecified: Secondary | ICD-10-CM

## 2019-10-13 DIAGNOSIS — R5382 Chronic fatigue, unspecified: Secondary | ICD-10-CM

## 2019-10-13 DIAGNOSIS — J0100 Acute maxillary sinusitis, unspecified: Secondary | ICD-10-CM

## 2019-10-13 DIAGNOSIS — G4719 Other hypersomnia: Secondary | ICD-10-CM

## 2019-10-13 DIAGNOSIS — R0683 Snoring: Secondary | ICD-10-CM

## 2019-10-13 DIAGNOSIS — R1319 Other dysphagia: Secondary | ICD-10-CM

## 2019-10-13 NOTE — Progress Notes (Signed)
SLEEP MEDICINE CLINIC    Provider:  Larey Seat, MD  Primary Care Physician:  Ann Held, DO [address] [place] [address]     Referring Provider: Carollee Herter, [address] [place],  [place] [id number]          Chief Complaint according to patient   Patient presents with:    . New Patient (Initial Visit)           HISTORY OF PRESENT ILLNESS:  Devin Ramos is a 53 y.o. year old Caucasian male patient seen here in a referral on 10/13/2019 from Dr. Etter Sjogren.  Chief concern according to patient : My wife has had better sleep since seen here for CPAP and wants me to get evaluated.   I have the pleasure of seeing Devin Ramos today, a right -handed Caucasian male with a possible sleep disorder. He   has a past medical history of Allergy, Environmental allergies, Eosinophilic esophagitis, Esophageal stenosis, Hiatal hernia, and Lumbar herniated disc.      Sleep relevant medical history: no insomnia, "bad"-snoring - bad enough for wife to sleep in a separate room. Nocturia 1-2 times, no Tonsillectomy, sinus/ ENT surgeries approximately 10 years ago, deviated septum repair.   Family medical /sleep history: His mother - the only other family member with OSA.   2 brothers with HTN, obesity.    Social history:  Patient is working as Education officer, museum and lives in a household with persons/ alone.  Family status is married ( wife is a patient here)  with adult children ( 73 and 80 years old) .The patient currently works daytime hour. Pets are present. 2 dogs. Tobacco use-never .  ETOH use; special a occassions only , Caffeine intake in form of Coffee( 2 in AM ) Soda( 2 in daytime ) Tea ( a couple a day) or energy drinks. 6 drinks a day.  Regular exercise in form of walking .   Hobbies: bowling.   Sleep habits are as follows: The patient's dinner time is between 8 PM. The patient goes to bed at 11 PM  and continues to sleep for 3-4 hours, wakes for 1-2 bathroom breaks.The preferred sleep position is supine , with the support of 1 pillow.  The bedroom is cool,quiet and dark.  Dreams are reportedly rare/ frequently. Averages less than 7 hours of sleep.  6-7 AM is the usual rise time. The patient wakes up spontaneously, before his alarm.  He reports not feeling refreshed or restored in AM, with symptoms such as dry mouth, sometimes morning headaches, and residual fatigue.  Naps are taken infrequently, on weekends , while watching TV - whenever not physically active or stimulated- lasting from 20 -25 minutes and are more refreshing than nocturnal sleep.    Review of Systems: Out of a complete 14 system review, the patient complains of only the following symptoms, and all other reviewed systems are negative.:  Fatigue, sleepiness , snoring, fragmented sleep, non- refreshing sleep.    How likely are you to doze in the following situations: 0 = not likely, 1 = slight chance, 2 = moderate chance, 3 = high chance   Sitting and Reading? Watching Television? Sitting inactive in a public place (theater or meeting)? As a passenger in a car for an hour without a break? Lying down in the afternoon when circumstances permit? Sitting and talking to someone? Sitting quietly after  lunch without alcohol? In a car, while stopped for a few minutes in traffic?   Total = 14/ 24 points   FSS endorsed at n/a / 63 points.  High level fatigue -  B 12 didn't help.   CBC normal, CMET normal, TSH normal.   Social History   Socioeconomic History  . Marital status: Married    Spouse name: Not on file  . Number of children: Not on file  . Years of education: Not on file  . Highest education level: Not on file  Occupational History  . Occupation: [employer]  Tobacco Use  . Smoking status: Never Smoker  . Smokeless tobacco: Never Used  Substance and Sexual Activity  . Alcohol use: No    Comment: rare  .  Drug use: No  . Sexual activity: Yes    Partners: Female  Other Topics Concern  . Not on file  Social History Narrative   Exercise-- walking ,  Physical job   Social Determinants of Health   Financial Resource Strain:   . Difficulty of Paying Living Expenses: Not on file  Food Insecurity:   . Worried About Charity fundraiser in the Last Year: Not on file  . Ran Out of Food in the Last Year: Not on file  Transportation Needs:   . Lack of Transportation (Medical): Not on file  . Lack of Transportation (Non-Medical): Not on file  Physical Activity:   . Days of Exercise per Week: Not on file  . Minutes of Exercise per Session: Not on file  Stress:   . Feeling of Stress : Not on file  Social Connections:   . Frequency of Communication with Friends and Family: Not on file  . Frequency of Social Gatherings with Friends and Family: Not on file  . Attends Religious Services: Not on file  . Active Member of Clubs or Organizations: Not on file  . Attends Archivist Meetings: Not on file  . Marital Status: Not on file    Family History  Problem Relation Age of Onset  . Prostate cancer Father   . Hypertension Father   . Cancer Father        prostate  . Heart disease Paternal Grandfather   . Stroke Paternal Grandfather   . Hypertension Mother   . Diabetes Mother   . Hypertension Brother   . Colon cancer Neg Hx   . Esophageal cancer Neg Hx   . Rectal cancer Neg Hx   . Stomach cancer Neg Hx     Past Medical History:  Diagnosis Date  . Allergy   . Environmental allergies   . Eosinophilic esophagitis   . Esophageal stenosis   . Hiatal hernia   . Lumbar herniated disc     Past Surgical History:  Procedure Laterality Date  . APPENDECTOMY    . ESOPHAGOGASTRODUODENOSCOPY (EGD) WITH PROPOFOL N/A 10/09/2016   Procedure: ESOPHAGOGASTRODUODENOSCOPY (EGD) WITH PROPOFOL;  Surgeon: Ladene Artist, MD;  Location: [facility] ENDOSCOPY;  Service: Endoscopy;  Laterality: N/A;  .  UPPER GASTROINTESTINAL ENDOSCOPY       Current Outpatient Medications on File Prior to Visit  Medication Sig Dispense Refill  . cetirizine (ZYRTEC) 10 MG tablet Take 10 mg by mouth daily.    Marland Kitchen loratadine (CLARITIN) 10 MG tablet Take by mouth.    Marland Kitchen omeprazole (PRILOSEC OTC) 20 MG tablet Take 20 mg by mouth daily.     No current facility-administered medications on file prior to visit.  Allergies  Allergen Reactions  . Latex Other (See Comments)    Eyes swell    Physical exam:  Today's Vitals   10/13/19 0842  BP: 122/84  Pulse: 70  Temp: 97.9 F (36.6 C)  Weight: 227 lb (103 kg)  Height: 6\' 2"  (1.88 m)   Body mass index is 29.15 kg/m.   Wt Readings from Last 3 Encounters:  10/13/19 227 lb (103 kg)  09/22/19 226 lb (102.5 kg)  09/15/19 228 lb 9.6 oz (103.7 kg)     Ht Readings from Last 3 Encounters:  10/13/19 6\' 2"  (1.88 m)  09/22/19 6\' 2"  (1.88 m)  09/15/19 6\' 4"  (1.93 m)      General: The patient is awake, alert and appears not in acute distress. The patient is well groomed. Head: Normocephalic, atraumatic. Neck is supple. Mallampati 2,  neck circumference:16. 5  inches . Nasal airflow patent. Retrognathia is not seen. Facial hair.  Dental status:  Cardiovascular:  Regular rate and cardiac rhythm by pulse, without distended neck veins. Respiratory: Lungs are clear to auscultation.  Skin:  Without evidence of ankle edema, or rash. Trunk: The patient's posture is erect.   Neurologic exam : The patient is awake and alert, oriented to place and time.   Memory subjective described as intact.  Attention span & concentration ability appears normal.  Speech is fluent,  without dysarthria, dysphonia or aphasia.  Mood and affect are appropriate.   Cranial nerves: no loss of smell or taste reported  Pupils are equal and briskly reactive to light. Funduscopic exam deferred..  Extraocular movements in vertical and horizontal planes were intact and without nystagmus.  No Diplopia. Visual fields by finger perimetry are intact. Hearing was intact to soft voice and finger rubbing.    Facial sensation intact to fine touch.  Facial motor strength is symmetric and tongue and uvula move midline.  Neck ROM : rotation, tilt and flexion extension were normal for age and shoulder shrug was symmetrical.    Motor exam:  Symmetric bulk, tone and ROM.   Normal tone without cog wheeling, symmetric grip strength . Sensory:  Fine touch, pinprick and vibration were tested  and  normal.  Proprioception tested in the upper extremities was normal.  Coordination: Rapid alternating movements in the fingers/hands were of normal speed.  The Finger-to-nose maneuver was intact without evidence of ataxia, dysmetria or tremor.   Gait and station: Patient could rise unassisted from a seated position, walked without assistive device ( RN observation). .  Stance is of normal width/ base and the patient turned with 3 steps.  Toe and heel walk were deferred.  Deep tendon reflexes: in the  upper and lower extremities are symmetric and intact.  Babinski response was deferred .       After spending a total time of  35  minutes face to face and additional time for physical and neurologic examination, review of laboratory studies,  personal review of imaging studies, reports and results of other testing and review of referral information / records as far as provided in visit, I have established the following assessments:  1)  Possible sleep apnea as patient is snoring, has excessive daytime fatigue and sleepiness.  2) labs show no explanation for fatigue.  3) medications are not explaining fatigue and EDS.    My Plan is to proceed with:  1)  PSG or HST for screening of sleep apnea.  2)  No narcolepsy symptoms.  3) no neurodegenerative disease history ,  no depression.   I would like to thank Carollee Herter, Alferd Apa, DO and [address],  [place] [address] for allowing me to meet with and to take care of this pleasant patient.   In short, Devin Ramos is presenting with snoring and EDS, non- restorative sleep.  symptom that could be attributed to OSA.   I plan to follow up either personally or through our NP within 2-3 month.   CC: I will share my notes with PCP [place].   Electronically signed by: Larey Seat, MD 10/13/2019 9:06 AM  [facility] Board certified by The [employer] of Sleep Medicine and Diplomate of the [employer] of Sleep Medicine. Board certified In Neurology through the [place], Fellow of the [employer] of Neurology. Medical Director of [facility].

## 2019-10-13 NOTE — Patient Instructions (Signed)

## 2019-11-09 ENCOUNTER — Telehealth: Payer: Self-pay

## 2019-11-09 NOTE — Telephone Encounter (Signed)
We have attempted to call the patient two times to schedule sleep study.  Patient has been unavailable at the phone numbers we have on file and has not returned our calls. If patient calls back we will schedule them for their sleep study.  

## 2019-12-27 ENCOUNTER — Telehealth: Payer: Self-pay

## 2019-12-27 DIAGNOSIS — L309 Dermatitis, unspecified: Secondary | ICD-10-CM

## 2019-12-27 NOTE — Telephone Encounter (Signed)
Patient  Called in to see if Dr. Etter Sjogren can put in a referral for a   Dermatologist for his hands. Per the patient he is only available on Monday's and Tuesdays.

## 2019-12-27 NOTE — Telephone Encounter (Signed)
Refer to derm  ?

## 2019-12-27 NOTE — Telephone Encounter (Signed)
Pt states having spots, dryness, and cracking on both hands. Pt was seen for this issue on 09/15/19. Pt states he was told to call if he wanted the referral. Please advise.

## 2019-12-28 NOTE — Telephone Encounter (Signed)
Referral placed.

## 2020-02-13 ENCOUNTER — Encounter: Payer: Self-pay | Admitting: Dermatology

## 2020-02-13 ENCOUNTER — Ambulatory Visit (INDEPENDENT_AMBULATORY_CARE_PROVIDER_SITE_OTHER): Payer: BC Managed Care – PPO | Admitting: Dermatology

## 2020-02-13 ENCOUNTER — Other Ambulatory Visit: Payer: Self-pay

## 2020-02-13 DIAGNOSIS — L409 Psoriasis, unspecified: Secondary | ICD-10-CM

## 2020-02-13 DIAGNOSIS — D1801 Hemangioma of skin and subcutaneous tissue: Secondary | ICD-10-CM | POA: Diagnosis not present

## 2020-02-13 DIAGNOSIS — Q825 Congenital non-neoplastic nevus: Secondary | ICD-10-CM | POA: Diagnosis not present

## 2020-02-13 DIAGNOSIS — L852 Keratosis punctata (palmaris et plantaris): Secondary | ICD-10-CM

## 2020-02-13 MED ORDER — HALOBETASOL PROPIONATE 0.05 % EX CREA: TOPICAL_CREAM | Freq: Every evening | CUTANEOUS | 3 refills | Status: DC

## 2020-02-13 NOTE — Patient Instructions (Addendum)
This is the first visit for Mr. Devin Ramos.  The past year he has had slowly progressive rash mainly on his hands with frequent painful fissures on the palms.  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  He did not see improvement with topical betamethasone.  He also notes thickening on his knees, particularly the left, but this may be related to his work posture.  Examination so showed psoriasiform patches on several knuckles and thickening and scale on the palms, with a deep fissure on the left palm.  There is some pink scale on the left elbow and more hyperkeratosis on the knees.  There is no involvement of the nails or the back of the scalp.  The differential is hand eczema versus psoriasis; I favor the latter.  Initially he will use halobetasol, preferably the ointment, which he will cover with a moist warm or cool cloth for 20 to 30 minutes daily.  After the moist compress he will dry the involved areas and reapply the ointment.  He was told that improvement takes an average of 4 to 6 weeks, and if he sees enough improvement he may cancel his follow-up.  Additionally, I did an abbreviated general skin check.  There is a small birth mole on his right lower back which requires no intervention.  He has multiple cherry angiomas, the largest of which is on the right upper shoulder and needs no intervention.  There is a 6 mm keratosis on the upper central forehead which is historically stable.  Follow-up in 6 weeks.

## 2020-02-17 ENCOUNTER — Encounter: Payer: Self-pay | Admitting: Dermatology

## 2020-02-17 NOTE — Progress Notes (Signed)
   New Patient   Subjective  Devin Ramos is a 54 y.o. male who presents for the following: Eczema (Both palms are dry and cracked x6-4months.  Itching and pain when they split open.  PCP gave him Augmented betamethasone dipropionate.  Also, tried an OTC eczema cream.  He is a Dealer.). Rash Location: Hands Duration: 1 year Quality: Progressively worse Associated Signs/Symptoms: Painful splits Modifying Factors: Topical betamethasone Severity:  Timing: Context:    The following portions of the chart were reviewed this encounter and updated as appropriate:     Objective  Well appearing patient in no apparent distress; mood and affect are within normal limits.  All skin waist up examined. Plus legs.   Assessment & Plan  Psoriasis (3) Left Hand - Anterior; Right Hand - Anterior; Left Forearm - Posterior  Topical halobetasol under moist compress 1-2x daily  Ordered Medications: halobetasol (ULTRAVATE) 0.05 % cream  This is the first visit for Devin Ramos.  The past year he has had slowly progressive rash mainly on his hands with frequent painful fissures on the palms.  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  He did not see improvement with topical betamethasone.  He also notes thickening on his knees, particularly the left, but this may be related to his work posture.  Examination so showed psoriasiform patches on several knuckles and thickening and scale on the palms, with a deep fissure on the left palm.  There is some pink scale on the left elbow and more hyperkeratosis on the knees.  There is no involvement of the nails or the back of the scalp.  The differential is hand eczema versus psoriasis; I favor the latter.  Initially he will use halobetasol, preferably the ointment, which he will cover with a moist warm or cool cloth for 20 to 30 minutes daily.  After the moist compress he will dry the involved areas and reapply the ointment.  He was  told that improvement takes an average of 4 to 6 weeks, and if he sees enough improvement he may cancel his follow-up.  Additionally, I did an abbreviated general skin check.  There is a small birth mole on his right lower back which requires no intervention.  He has multiple cherry angiomas, the largest of which is on the right upper shoulder and needs no intervention.  There is a 6 mm keratosis on the upper central forehead which is historically stable.  Follow-up in 6 weeks.  Assessment & Plan  Psoriasis (2) Left Hand - Anterior; Right Hand - Anterior  Ordered Medications: halobetasol (ULTRAVATE) 0.05 % cream

## 2020-03-26 ENCOUNTER — Ambulatory Visit: Payer: BC Managed Care – PPO | Admitting: Dermatology

## 2020-10-05 ENCOUNTER — Other Ambulatory Visit: Payer: Self-pay

## 2020-10-05 ENCOUNTER — Emergency Department (HOSPITAL_BASED_OUTPATIENT_CLINIC_OR_DEPARTMENT_OTHER)
Admission: EM | Admit: 2020-10-05 | Discharge: 2020-10-05 | Disposition: A | Discharge status: Home / Self Care | Payer: BC Managed Care – PPO | Attending: Emergency Medicine | Admitting: Emergency Medicine

## 2020-10-05 ENCOUNTER — Encounter (HOSPITAL_BASED_OUTPATIENT_CLINIC_OR_DEPARTMENT_OTHER): Payer: Self-pay

## 2020-10-05 DIAGNOSIS — S199XXA Unspecified injury of neck, initial encounter: Secondary | ICD-10-CM | POA: Diagnosis present

## 2020-10-05 DIAGNOSIS — Y92410 Unspecified street and highway as the place of occurrence of the external cause: Secondary | ICD-10-CM | POA: Diagnosis not present

## 2020-10-05 DIAGNOSIS — S161XXA Strain of muscle, fascia and tendon at neck level, initial encounter: Secondary | ICD-10-CM | POA: Insufficient documentation

## 2020-10-05 DIAGNOSIS — Z91040 Latex allergy status: Secondary | ICD-10-CM | POA: Insufficient documentation

## 2020-10-05 MED ORDER — METHOCARBAMOL 500 MG PO TABS: 500.0000 mg | ORAL_TABLET | Freq: Two times a day (BID) | ORAL | 0 refills | Status: DC
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
## 2021-01-21 ENCOUNTER — Other Ambulatory Visit: Payer: Self-pay

## 2021-01-21 ENCOUNTER — Ambulatory Visit (INDEPENDENT_AMBULATORY_CARE_PROVIDER_SITE_OTHER): Payer: BC Managed Care – PPO | Admitting: Dermatology

## 2021-01-21 ENCOUNTER — Encounter: Payer: Self-pay | Admitting: Dermatology

## 2021-01-21 DIAGNOSIS — L409 Psoriasis, unspecified: Secondary | ICD-10-CM | POA: Diagnosis not present

## 2021-01-21 DIAGNOSIS — Z79899 Other long term (current) drug therapy: Secondary | ICD-10-CM

## 2021-01-21 MED ORDER — HALOBETASOL PROPIONATE 0.05 % EX CREA: TOPICAL_CREAM | Freq: Every evening | CUTANEOUS | 3 refills | Status: DC

## 2021-01-21 MED ORDER — ACITRETIN 25 MG PO CAPS: 25.0000 mg | ORAL_CAPSULE | Freq: Every day | ORAL | 1 refills | Status: AC

## 2021-01-21 NOTE — Progress Notes (Signed)
[place]

## 2021-01-21 NOTE — Patient Instructions (Addendum)
[email or web address]  Acitretin (generic Soriatane) 1 tablet daily 25 mg

## 2021-01-22 LAB — CBC WITH DIFFERENTIAL/PLATELET
Absolute Monocytes: 279 cells/uL (ref 200–950)
Basophils Absolute: 97 cells/uL (ref 0–200)
Basophils Relative: 1.7 %
Eosinophils Absolute: 200 cells/uL (ref 15–500)
Eosinophils Relative: 3.5 %
HCT: 41.8 % (ref 38.5–50.0)
Hemoglobin: 14.2 g/dL (ref 13.2–17.1)
Lymphs Abs: 1231 cells/uL (ref 850–3900)
MCH: 30.9 pg (ref 27.0–33.0)
MCHC: 34 g/dL (ref 32.0–36.0)
MCV: 91.1 fL (ref 80.0–100.0)
MPV: 9.6 fL (ref 7.5–12.5)
Monocytes Relative: 4.9 %
Neutro Abs: 3893 cells/uL (ref 1500–7800)
Neutrophils Relative %: 68.3 %
Platelets: 209 10*3/uL (ref 140–400)
RBC: 4.59 10*6/uL (ref 4.20–5.80)
RDW: 12.7 % (ref 11.0–15.0)
Total Lymphocyte: 21.6 %
WBC: 5.7 10*3/uL (ref 3.8–10.8)

## 2021-01-22 LAB — COMPREHENSIVE METABOLIC PANEL
AG Ratio: 2 (calc) (ref 1.0–2.5)
ALT: 20 U/L (ref 9–46)
AST: 8 U/L — ABNORMAL LOW (ref 10–35)
Albumin: 4.5 g/dL (ref 3.6–5.1)
Alkaline phosphatase (APISO): 56 U/L (ref 35–144)
BUN: 14 mg/dL (ref 7–25)
CO2: 26 mmol/L (ref 20–32)
Calcium: 9.1 mg/dL (ref 8.6–10.3)
Chloride: 107 mmol/L (ref 98–110)
Creat: 0.79 mg/dL (ref 0.70–1.33)
Globulin: 2.3 g/dL (calc) (ref 1.9–3.7)
Glucose, Bld: 98 mg/dL (ref 65–99)
Potassium: 4.7 mmol/L (ref 3.5–5.3)
Sodium: 143 mmol/L (ref 135–146)
Total Bilirubin: 0.5 mg/dL (ref 0.2–1.2)
Total Protein: 6.8 g/dL (ref 6.1–8.1)

## 2021-01-22 LAB — LIPID PANEL
Cholesterol: 160 mg/dL (ref <200)
HDL: 37 mg/dL — ABNORMAL LOW (ref >=40)
LDL Cholesterol (Calc): 100 mg/dL (calc) — ABNORMAL HIGH
Non-HDL Cholesterol (Calc): 123 mg/dL (calc) (ref <130)
Total CHOL/HDL Ratio: 4.3 (calc) (ref <5.0)
Triglycerides: 135 mg/dL (ref <150)

## 2021-01-29 ENCOUNTER — Encounter: Payer: Self-pay | Admitting: Dermatology

## 2021-01-29 NOTE — Progress Notes (Signed)
   Follow-Up Visit   Subjective  Devin Ramos is a 55 y.o. male who presents for the following: Rash (Habolasol Cream is no longer working, rash has continued to come back and spread over arms, hands, elbows, and legs.).  Psoriasis Location:  Duration:  Quality: Reading Associated Signs/Symptoms: Itching, stinging. Modifying Factors: Halobetasol no longer helping. Severity:  Timing: Context:   Objective  Well appearing patient in no apparent distress; mood and affect are within normal limits. Objective  Left Antecubital Fossa, Left Elbow - Posterior, Left Hand - Anterior, Left Knee - Anterior, Mid Back, Right Elbow - Posterior, Right Hand - Anterior, Right Knee - Anterior, Right Upper Arm - Anterior, Scalp: Moderately thick medium plaque psoriasis involving 15 to 20% BSA.  Failed halobetasol.     A focused examination was performed including Head, neck, arms, legs, torso.. Relevant physical exam findings are noted in the Assessment and Plan.   Assessment & Plan    Psoriasis (10) Left Antecubital Fossa; Left Elbow - Posterior; Right Elbow - Posterior; Left Hand - Anterior; Right Hand - Anterior; Left Knee - Anterior; Right Knee - Anterior; Right Upper Arm - Anterior; Mid Back; Scalp  Detailed discussion about essentially all treatment options including the association of psoriasis with increased risk of all components of the adult metabolic disorder and the possibility that this risk could be reduced with biologic therapy.  Because of the relative thickness of his plaque disease we will initially try Soriatane for 2 to 3 months.  All side effects detailed.  CBC, CMET, LIPID STARTING ACITRETIN 1 TABLET DAILY 25MG   acitretin (SORIATANE) 25 MG capsule - Left Antecubital Fossa, Left Hand - Anterior, Left Knee - Anterior, Right Hand - Anterior, Right Knee - Anterior, Right Upper Arm - Anterior  halobetasol (ULTRAVATE) 0.05 % cream - Left Antecubital Fossa, Left Hand - Anterior,  Left Knee - Anterior, Right Hand - Anterior, Right Knee - Anterior, Right Upper Arm - Anterior  Other Related Procedures CBC with Differential/Platelet Lipid panel Comprehensive metabolic panel  Encounter for long-term (current) use of medications  Other Related Procedures CBC with Differential/Platelet Lipid panel Comprehensive metabolic panel      I, Lavonna Monarch, MD, have reviewed all documentation for this visit.  The documentation on 01/29/21 for the exam, diagnosis, procedures, and orders are all accurate and complete.

## 2021-04-02 ENCOUNTER — Ambulatory Visit: Payer: BC Managed Care – PPO | Admitting: Dermatology

## 2021-06-23 ENCOUNTER — Ambulatory Visit (HOSPITAL_COMMUNITY)
Admission: EM | Admit: 2021-06-23 | Discharge: 2021-06-23 | Disposition: A | Discharge status: Home / Self Care | Payer: BC Managed Care – PPO | Attending: Emergency Medicine | Admitting: Emergency Medicine

## 2021-06-23 ENCOUNTER — Other Ambulatory Visit: Payer: Self-pay

## 2021-06-23 ENCOUNTER — Encounter (HOSPITAL_COMMUNITY): Payer: Self-pay

## 2021-06-23 DIAGNOSIS — B349 Viral infection, unspecified: Secondary | ICD-10-CM

## 2021-06-23 DIAGNOSIS — U071 COVID-19: Secondary | ICD-10-CM | POA: Diagnosis not present

## 2021-06-23 NOTE — Discharge Instructions (Addendum)
We will contact you if your COVID test is positive.  Please quarantine while you wait for the results.  If your test is negative you may resume normal activities.  If your test is positive please continue to quarantine for at least 5 days from your symptom onset or until you are without a fever for at least 24 hours after the medications.    You can take Tylenol and/or Ibuprofen as needed for fever reduction and pain relief.   For cough: honey 1/2 to 1 teaspoon (you can dilute the honey in water or another fluid).  You can also use guaifenesin and dextromethorphan for cough. You can use a humidifier for chest congestion and cough.  If you don't have a humidifier, you can sit in the bathroom with the hot shower running.      For sore throat: try warm salt water gargles, cepacol lozenges, throat spray, warm tea or water with lemon/honey, popsicles or ice, or OTC cold relief medicine for throat discomfort.   For congestion: take a daily anti-histamine like Zyrtec, Claritin, and a oral decongestant, such as pseudoephedrine.  You can also use Flonase 1-2 sprays in each nostril daily.   It is important to stay hydrated: drink plenty of fluids (water, gatorade/powerade/pedialyte, juices, or teas) to keep your throat moisturized and help further relieve irritation/discomfort.

## 2021-06-23 NOTE — ED Triage Notes (Signed)
Pt c/o headache, took at home Covid-19 test that was positive. Pt requesting to be tested for Covid here.

## 2021-06-23 NOTE — ED Provider Notes (Signed)
[place] [facility]    CSN: [id number] Arrival date & time: 06/23/21  1627      History   Chief Complaint Chief Complaint  Patient presents with   Headache    HPI Devin Ramos is a 55 y.o. male.   Patient presents with intermittent generalized headache beginning 2 to 3 days ago.  Endorses chronic cough, nasal congestion.  Denies fevers, chills, body aches, ear pain or fullness, shortness of breath, wheezing, abdominal pain, nausea, vomiting, diarrhea.  No known sick contacts, vaccinated.  Home COVID test positive.  Requesting retest.  Past Medical History:  Diagnosis Date   Allergy    Environmental allergies    Eosinophilic esophagitis    Esophageal stenosis    Hiatal hernia    Lumbar herniated disc     Patient Active Problem List   Diagnosis Date Noted   Esophageal obstruction due to food impaction    Esophageal dysphagia    Preventative health care 06/28/2015   Left hip pain 03/16/2015   Sinusitis, acute maxillary 11/01/2014   Pain of left hand 09/16/2013   Viral URI 11/18/2011   [place]C DISEASE, LUMBAR 08/16/2009   ANEMIA, VITAMIN B12 DEFICIENCY 02/28/2009   UNSPECIFIED MONONEURITIS OF LOWER LIMB 02/21/2009   ALLERGIC RHINITIS 07/14/2007    Past Surgical History:  Procedure Laterality Date   APPENDECTOMY     ESOPHAGOGASTRODUODENOSCOPY (EGD) WITH PROPOFOL N/A 10/09/2016   Procedure: ESOPHAGOGASTRODUODENOSCOPY (EGD) WITH PROPOFOL;  Surgeon: Ladene Artist, MD;  Location: [facility];  Service: Endoscopy;  Laterality: N/A;   UPPER GASTROINTESTINAL ENDOSCOPY         Home Medications    Prior to Admission medications   Medication Sig Start Date End Date Taking? Authorizing Provider  cetirizine (ZYRTEC) 10 MG tablet Take 10 mg by mouth daily.    [provider]  halobetasol (ULTRAVATE) 0.05 % cream Apply topically every evening. Apply QHS to moist skin as directed 01/21/21   Lavonna Monarch, MD  loratadine (CLARITIN) 10 MG tablet Take by mouth.     [provider]  methocarbamol (ROBAXIN) 500 MG tablet Take 1 tablet (500 mg total) by mouth 2 (two) times daily. 10/05/20   Sponseller, Gypsy Balsam, PA-C  omeprazole (PRILOSEC OTC) 20 MG tablet Take 20 mg by mouth daily.    [provider]    Family History Family History  Problem Relation Age of Onset   Prostate cancer Father    Hypertension Father    Cancer Father        prostate   Heart disease Paternal Grandfather    Stroke Paternal Grandfather    Hypertension Mother    Diabetes Mother    Hypertension Brother    Colon cancer Neg Hx    Esophageal cancer Neg Hx    Rectal cancer Neg Hx    Stomach cancer Neg Hx     Social History Social History   Tobacco Use   Smoking status: Never   Smokeless tobacco: Never  Vaping Use   Vaping Use: Never used  Substance Use Topics   Alcohol use: Yes    Comment: rare   Drug use: No     Allergies   Latex   Review of Systems Review of Systems Deferred HPI   Physical Exam Triage Vital Signs ED Triage Vitals  Enc Vitals Group     BP 06/23/21 1709 127/85     Pulse Rate 06/23/21 1709 92     Resp 06/23/21 1709 18  Temp 06/23/21 1709 99.9 F (37.7 C)     Temp Source 06/23/21 1709 Oral     SpO2 06/23/21 1709 97 %     Weight --      Height --      Head Circumference --      Peak Flow --      Pain Score 06/23/21 1708 4     Pain Loc --      Pain Edu? --      Excl. in [place]? --    No data found.  Updated Vital Signs BP 127/85 (BP Location: Right Arm)   Pulse 92   Temp 99.9 F (37.7 C) (Oral)   Resp 18   SpO2 97%   Visual Acuity Right Eye Distance:   Left Eye Distance:   Bilateral Distance:    Right Eye Near:   Left Eye Near:    Bilateral Near:     Physical Exam Constitutional:      Appearance: Normal appearance. He is normal weight.  HENT:     Head: Normocephalic.     Right Ear: Tympanic membrane, ear canal and external ear normal.     Left Ear: Tympanic membrane, ear canal and  external ear normal.     Nose: Congestion present. No rhinorrhea.     Mouth/Throat:     Mouth: Mucous membranes are moist.     Pharynx: Oropharynx is clear.  Eyes:     Extraocular Movements: Extraocular movements intact.  Pulmonary:     Effort: Pulmonary effort is normal.  Musculoskeletal:     Cervical back: Normal range of motion and neck supple.  Skin:    General: Skin is warm and dry.  Neurological:     Mental Status: He is alert and oriented to person, place, and time. Mental status is at baseline.  Psychiatric:        Mood and Affect: Mood normal.        Behavior: Behavior normal.     UC Treatments / Results  Labs (all labs ordered are listed, but only abnormal results are displayed) Labs Reviewed  SARS CORONAVIRUS 2 (TAT 6-24 HRS)    EKG   Radiology No results found.  Procedures Procedures (including critical care time)  Medications Ordered in UC Medications - No data to display  Initial Impression / Assessment and Plan / UC Course  I have reviewed the triage vital signs and the nursing notes.  Pertinent labs & imaging results that were available during my care of the patient were reviewed by me and considered in my medical decision making (see chart for details).  Viral illness  1.  COVID test pending 2.  Over-the-counter occasions for symptom management 3.  Return precautions given for worsening signs of infection to go to urgent care nearest emergency department for evaluation Final Clinical Impressions(s) / UC Diagnoses   Final diagnoses:  Viral illness     Discharge Instructions      We will contact you if your COVID test is positive.  Please quarantine while you wait for the results.  If your test is negative you may resume normal activities.  If your test is positive please continue to quarantine for at least 5 days from your symptom onset or until you are without a fever for at least 24 hours after the medications.    You can take Tylenol  and/or Ibuprofen as needed for fever reduction and pain relief.   For cough: honey 1/2 to 1 teaspoon (you  can dilute the honey in water or another fluid).  You can also use guaifenesin and dextromethorphan for cough. You can use a humidifier for chest congestion and cough.  If you don't have a humidifier, you can sit in the bathroom with the hot shower running.      For sore throat: try warm salt water gargles, cepacol lozenges, throat spray, warm tea or water with lemon/honey, popsicles or ice, or OTC cold relief medicine for throat discomfort.   For congestion: take a daily anti-histamine like Zyrtec, Claritin, and a oral decongestant, such as pseudoephedrine.  You can also use Flonase 1-2 sprays in each nostril daily.   It is important to stay hydrated: drink plenty of fluids (water, gatorade/powerade/pedialyte, juices, or teas) to keep your throat moisturized and help further relieve irritation/discomfort.       ED Prescriptions   None    PDMP not reviewed this encounter.   Hans Eden, NP 06/23/21 1736

## 2021-06-24 LAB — SARS CORONAVIRUS 2 (TAT 6-24 HRS): SARS Coronavirus 2: POSITIVE — AB

## 2021-07-01 ENCOUNTER — Other Ambulatory Visit: Payer: Self-pay

## 2021-07-02 ENCOUNTER — Ambulatory Visit (INDEPENDENT_AMBULATORY_CARE_PROVIDER_SITE_OTHER): Payer: BC Managed Care – PPO | Admitting: Family Medicine

## 2021-07-02 ENCOUNTER — Encounter: Payer: Self-pay | Admitting: Family Medicine

## 2021-07-02 ENCOUNTER — Ambulatory Visit (HOSPITAL_BASED_OUTPATIENT_CLINIC_OR_DEPARTMENT_OTHER)
Admission: RE | Admit: 2021-07-02 | Discharge: 2021-07-02 | Disposition: A | Discharge status: Home / Self Care | Payer: BC Managed Care – PPO | Source: Ambulatory Visit | Attending: Family Medicine | Admitting: Family Medicine

## 2021-07-02 VITALS — BP 110/78 | HR 82 | Temp 99.0°F | Resp 18 | Ht 74.0 in | Wt 226.6 lb

## 2021-07-02 DIAGNOSIS — Z1159 Encounter for screening for other viral diseases: Secondary | ICD-10-CM

## 2021-07-02 DIAGNOSIS — Z0000 Encounter for general adult medical examination without abnormal findings: Secondary | ICD-10-CM | POA: Diagnosis not present

## 2021-07-02 DIAGNOSIS — Z125 Encounter for screening for malignant neoplasm of prostate: Secondary | ICD-10-CM | POA: Diagnosis not present

## 2021-07-02 DIAGNOSIS — L409 Psoriasis, unspecified: Secondary | ICD-10-CM | POA: Diagnosis not present

## 2021-07-02 DIAGNOSIS — Z1211 Encounter for screening for malignant neoplasm of colon: Secondary | ICD-10-CM

## 2021-07-02 DIAGNOSIS — M545 Low back pain, unspecified: Secondary | ICD-10-CM | POA: Insufficient documentation

## 2021-07-02 HISTORY — DX: Psoriasis, unspecified: L40.9

## 2021-07-02 LAB — COMPREHENSIVE METABOLIC PANEL
ALT: 29 U/L (ref 0–53)
AST: 10 U/L (ref 0–37)
Albumin: 4.3 g/dL (ref 3.5–5.2)
Alkaline Phosphatase: 56 U/L (ref 39–117)
BUN: 14 mg/dL (ref 6–23)
CO2: 28 mEq/L (ref 19–32)
Calcium: 9.2 mg/dL (ref 8.4–10.5)
Chloride: 105 mEq/L (ref 96–112)
Creatinine, Ser: 0.88 mg/dL (ref 0.40–1.50)
GFR: 98.41 mL/min (ref >60.00)
Glucose, Bld: 85 mg/dL (ref 70–99)
Potassium: 4.4 mEq/L (ref 3.5–5.1)
Sodium: 140 mEq/L (ref 135–145)
Total Bilirubin: 0.5 mg/dL (ref 0.2–1.2)
Total Protein: 6.7 g/dL (ref 6.0–8.3)

## 2021-07-02 LAB — CBC WITH DIFFERENTIAL/PLATELET
Basophils Absolute: 0 10*3/uL (ref 0.0–0.1)
Basophils Relative: 0.7 % (ref 0.0–3.0)
Eosinophils Absolute: 0.2 10*3/uL (ref 0.0–0.7)
Eosinophils Relative: 2.3 % (ref 0.0–5.0)
HCT: 41.1 % (ref 39.0–52.0)
Hemoglobin: 14.2 g/dL (ref 13.0–17.0)
Lymphocytes Relative: 19.1 % (ref 12.0–46.0)
Lymphs Abs: 1.3 10*3/uL (ref 0.7–4.0)
MCHC: 34.6 g/dL (ref 30.0–36.0)
MCV: 88 fl (ref 78.0–100.0)
Monocytes Absolute: 0.4 10*3/uL (ref 0.1–1.0)
Monocytes Relative: 6.4 % (ref 3.0–12.0)
Neutro Abs: 4.9 10*3/uL (ref 1.4–7.7)
Neutrophils Relative %: 71.5 % (ref 43.0–77.0)
Platelets: 235 10*3/uL (ref 150.0–400.0)
RBC: 4.67 Mil/uL (ref 4.22–5.81)
RDW: 12.7 % (ref 11.5–15.5)
WBC: 6.9 10*3/uL (ref 4.0–10.5)

## 2021-07-02 LAB — LIPID PANEL
Cholesterol: 166 mg/dL (ref 0–200)
HDL: 27.4 mg/dL — ABNORMAL LOW (ref >39.00)
NonHDL: 138.68
Total CHOL/HDL Ratio: 6
Triglycerides: 204 mg/dL — ABNORMAL HIGH (ref 0.0–149.0)
VLDL: 40.8 mg/dL — ABNORMAL HIGH (ref 0.0–40.0)

## 2021-07-02 LAB — LDL CHOLESTEROL, DIRECT: Direct LDL: 111 mg/dL

## 2021-07-02 LAB — PSA: PSA: 0.51 ng/mL (ref 0.10–4.00)

## 2021-07-02 LAB — TSH: TSH: 2.19 u[IU]/mL (ref 0.35–5.50)

## 2021-07-02 NOTE — Assessment & Plan Note (Signed)
Xray today Consider pt vs ortho

## 2021-07-02 NOTE — Assessment & Plan Note (Signed)
ghm utd Check labs  See avs  

## 2021-07-02 NOTE — Assessment & Plan Note (Signed)
New referral to derm
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
## 2021-07-02 NOTE — Patient Instructions (Signed)
Preventive Care 40-53 Years Old, Male Preventive care refers to lifestyle choices and visits with your health care provider that can promote health and wellness. This includes: A yearly physical exam. This is also called an annual wellness visit. Regular dental and eye exams. Immunizations. Screening for certain conditions. Healthy lifestyle choices, such as: Eating a healthy diet. Getting regular exercise. Not using drugs or products that contain nicotine and tobacco. Limiting alcohol use. What can I expect for my preventive care visit? Physical exam Your health care provider will check your: Height and weight. These may be used to calculate your BMI (body mass index). BMI is a measurement that tells if you are at a healthy weight. Heart rate and blood pressure. Body temperature. Skin for abnormal spots. Counseling Your health care provider may ask you questions about your: Past medical problems. Family's medical history. Alcohol, tobacco, and drug use. Emotional well-being. Home life and relationship well-being. Sexual activity. Diet, exercise, and sleep habits. Work and work environment. Access to firearms. What immunizations do I need?  Vaccines are usually given at various ages, according to a schedule. Your health care provider will recommend vaccines for you based on your age, medicalhistory, and lifestyle or other factors, such as travel or where you work. What tests do I need? Blood tests Lipid and cholesterol levels. These may be checked every 5 years, or more often if you are over 50 years old. Hepatitis C test. Hepatitis B test. Screening Lung cancer screening. You may have this screening every year starting at age 55 if you have a 30-pack-year history of smoking and currently smoke or have quit within the past 15 years. Prostate cancer screening. Recommendations will vary depending on your family history and other risks. Genital exam to check for testicular cancer  or hernias. Colorectal cancer screening. All adults should have this screening starting at age 50 and continuing until age 75. Your health care provider may recommend screening at age 45 if you are at increased risk. You will have tests every 1-10 years, depending on your results and the type of screening test. Diabetes screening. This is done by checking your blood sugar (glucose) after you have not eaten for a while (fasting). You may have this done every 1-3 years. STD (sexually transmitted disease) testing, if you are at risk. Follow these instructions at home: Eating and drinking  Eat a diet that includes fresh fruits and vegetables, whole grains, lean protein, and low-fat dairy products. Take vitamin and mineral supplements as recommended by your health care provider. Do not drink alcohol if your health care provider tells you not to drink. If you drink alcohol: Limit how much you have to 0-2 drinks a day. Be aware of how much alcohol is in your drink. In the [place] one drink equals one 12 oz bottle of beer (355 mL), one 5 oz glass of wine (148 mL), or one 1 oz glass of hard liquor (44 mL).  Lifestyle Take daily care of your teeth and gums. Brush your teeth every morning and night with fluoride toothpaste. Floss one time each day. Stay active. Exercise for at least 30 minutes 5 or more days each week. Do not use any products that contain nicotine or tobacco, such as cigarettes, e-cigarettes, and chewing tobacco. If you need help quitting, ask your health care provider. Do not use drugs. If you are sexually active, practice safe sex. Use a condom or other form of protection to prevent STIs (sexually transmitted infections). If told by   your health care provider, take low-dose aspirin daily starting at age 50. Find healthy ways to cope with stress, such as: Meditation, yoga, or listening to music. Journaling. Talking to a trusted person. Spending time with friends and  family. Safety Always wear your seat belt while driving or riding in a vehicle. Do not drive: If you have been drinking alcohol. Do not ride with someone who has been drinking. When you are tired or distracted. While texting. Wear a helmet and other protective equipment during sports activities. If you have firearms in your house, make sure you follow all gun safety procedures. What's next? Go to your health care provider once a year for an annual wellness visit. Ask your health care provider how often you should have your eyes and teeth checked. Stay up to date on all vaccines. This information is not intended to replace advice given to you by your health care provider. Make sure you discuss any questions you have with your healthcare provider. Document Revised: 07/19/2019 Document Reviewed: 10/14/2018 Elsevier Patient Education  2022 [employer] Inc[employer]  

## 2021-07-03 LAB — HEPATITIS C ANTIBODY
Hepatitis C Ab: NONREACTIVE
SIGNAL TO CUT-OFF: 0.01 (ref <1.00)

## 2021-07-04 ENCOUNTER — Other Ambulatory Visit: Payer: Self-pay | Admitting: Family Medicine

## 2021-07-04 DIAGNOSIS — M545 Low back pain, unspecified: Secondary | ICD-10-CM

## 2021-07-29 ENCOUNTER — Ambulatory Visit
Admission: RE | Admit: 2021-07-29 | Discharge: 2021-07-29 | Disposition: A | Discharge status: Home / Self Care | Payer: BC Managed Care – PPO | Source: Ambulatory Visit | Attending: Family Medicine | Admitting: Family Medicine

## 2021-07-29 ENCOUNTER — Other Ambulatory Visit: Payer: Self-pay | Admitting: Family Medicine

## 2021-07-29 ENCOUNTER — Other Ambulatory Visit: Payer: Self-pay

## 2021-07-29 DIAGNOSIS — M545 Low back pain, unspecified: Secondary | ICD-10-CM

## 2021-09-24 ENCOUNTER — Telehealth: Payer: Self-pay

## 2021-09-24 NOTE — Telephone Encounter (Signed)
Pt called back regarding imaging. Pt states he would like to think about the ortho referral. Pt advised to call back.

## 2022-04-29 IMAGING — CR DG ORBITS FOR FOREIGN BODY
2 series · 2 of 2 positions shown · non-contrast
Comparison: 09/25/2009

CLINICAL DATA: Metal working/exposure; clearance prior to MRI

EXAM:
ORBITS FOR FOREIGN BODY - 2 VIEW

[w orbit pa (1 of 2)]
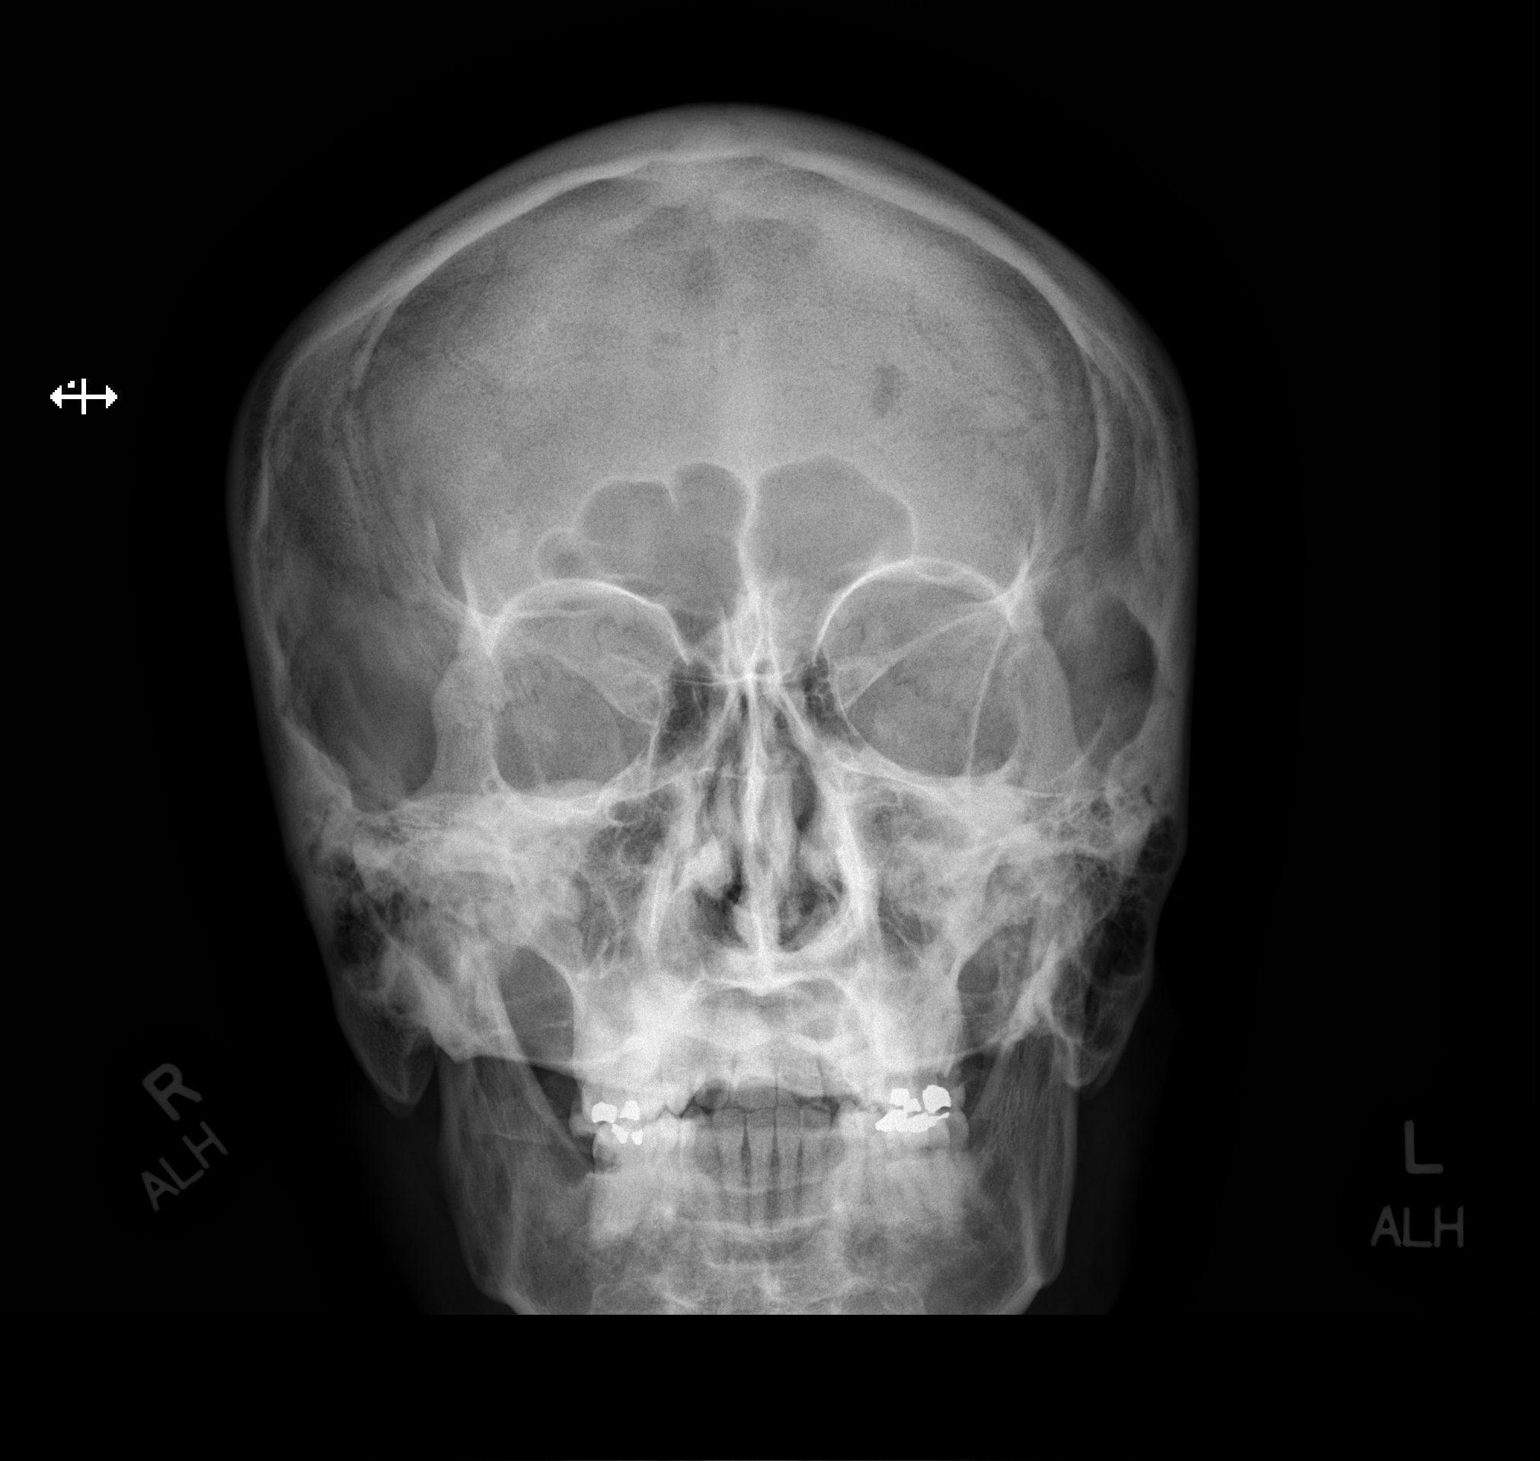

[w orbit pa (2 of 2)]
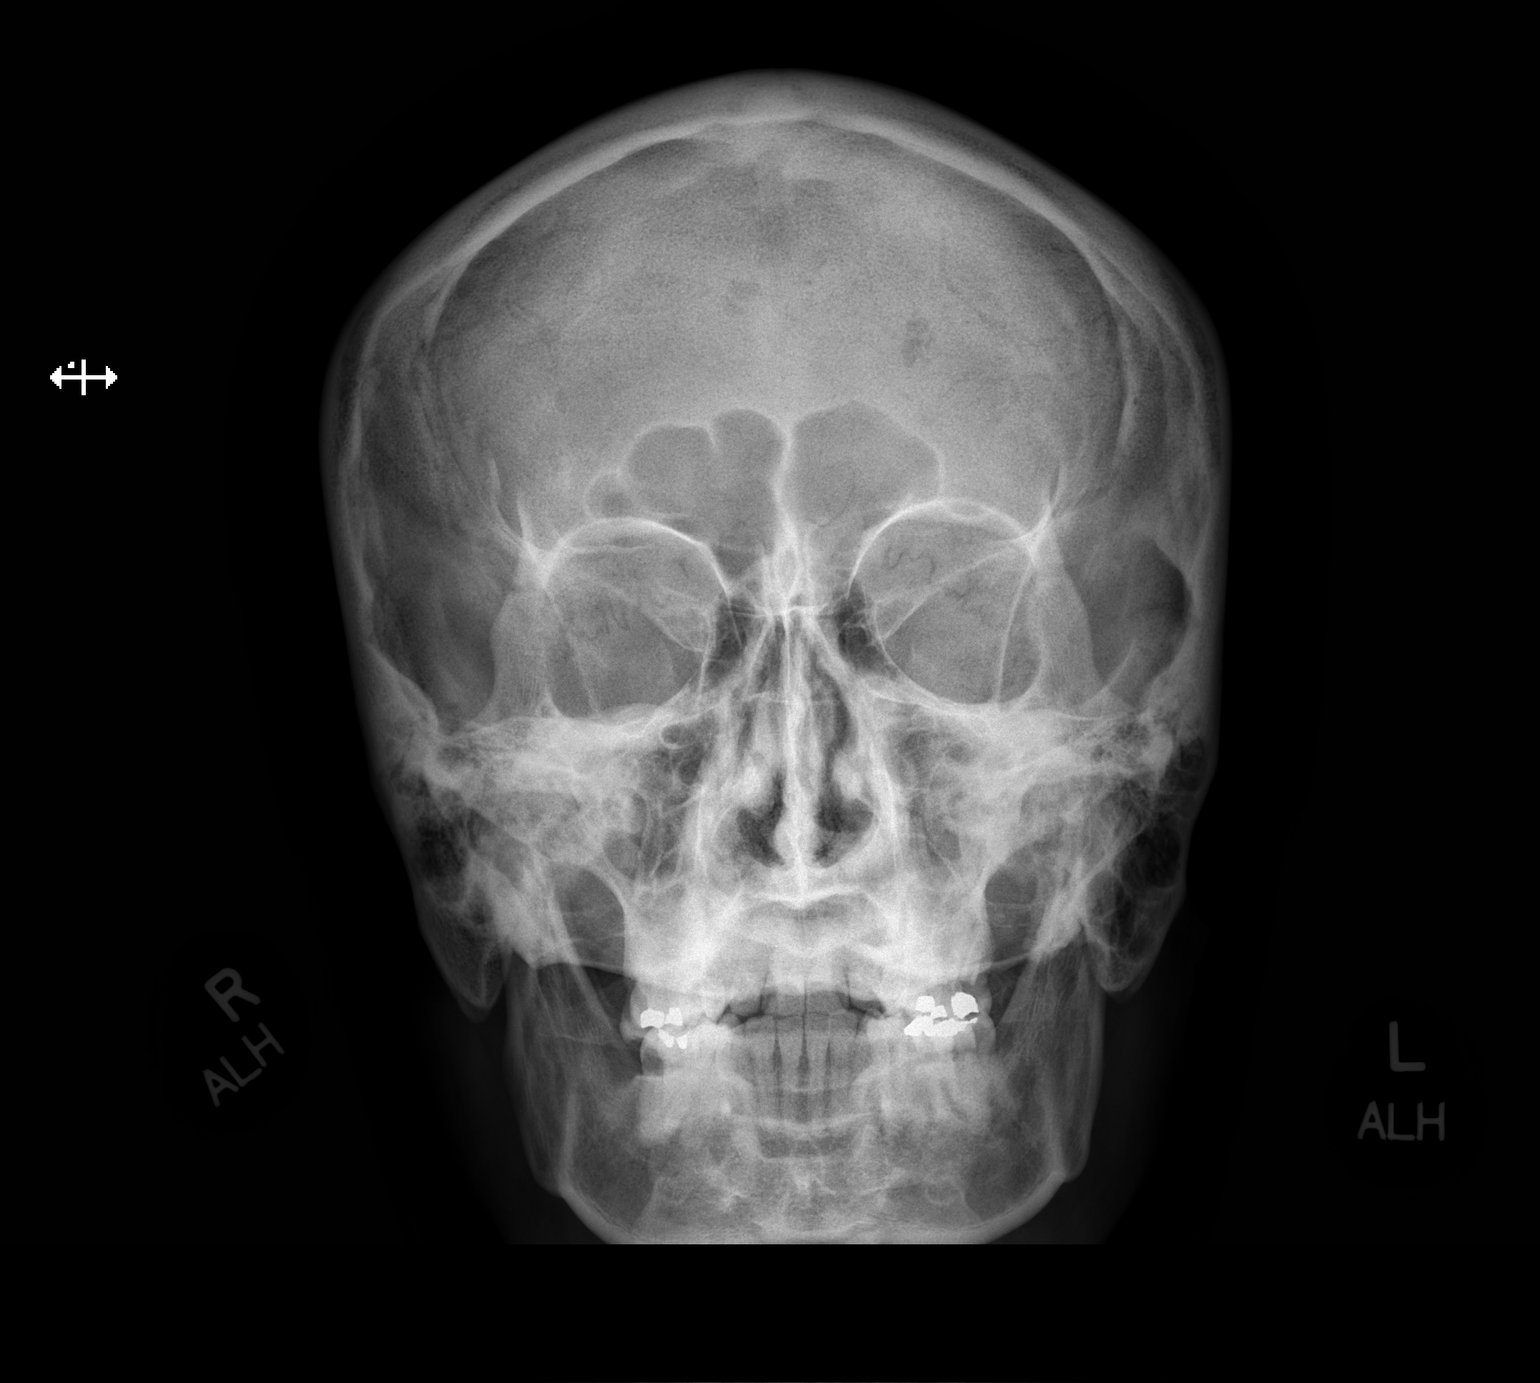

[2 of 2 positions shown; findings below may reference images not displayed]

FINDINGS: No orbital metallic foreign bodies.

Paranasal sinuses clear.

Osseous structures unremarkable.
IMPRESSION: No orbital metallic foreign bodies.

## 2022-04-29 IMAGING — MR MR LUMBAR SPINE W/O CM
4 of 5 series · 27 of 48 positions shown · non-contrast
Comparison: 09/24/2009

CLINICAL DATA: Low back pain greater than 6 weeks. Chronic and
persistent low back pain with some numbness in the left leg and
foot.

EXAM:
MRI LUMBAR SPINE WITHOUT CONTRAST
TECHNIQUE: Multiplanar, multisequence MR imaging of the lumbar spine was
performed. No intravenous contrast was administered.

[Series 3: T2 · sagittal · 4.0mm · 1.09mm/px · 5 of 15 slices shown (1 of 2)]
[im 1/15]
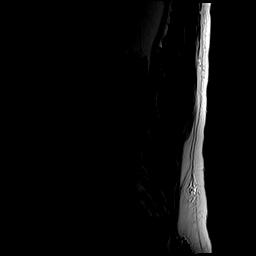
[im 4/15]
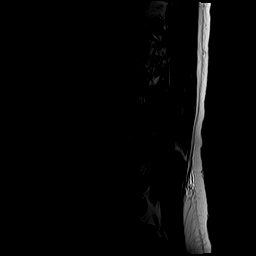
[im 8/15]
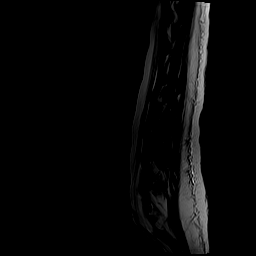
[im 11/15]
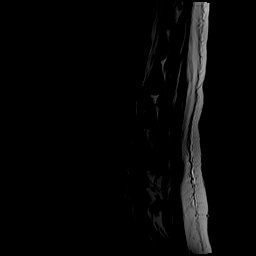
[im 15/15]
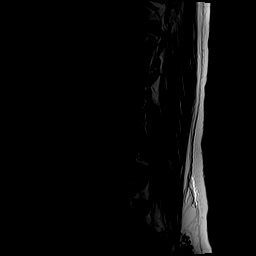

[Series 5: T1 · sagittal · 4.0mm · 1.09mm/px · 6 of 15 slices shown (1 of 2)]
[im 1/15]
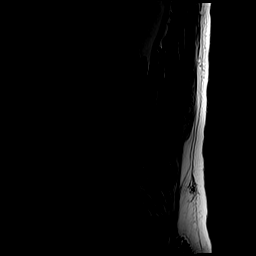
[im 3/15]
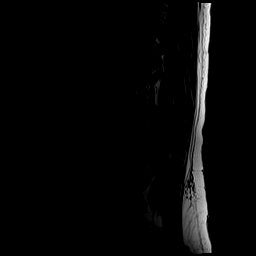
[im 6/15]
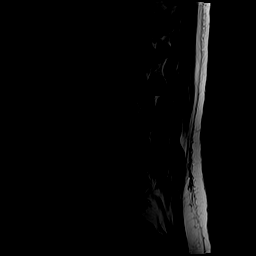
[im 9/15]
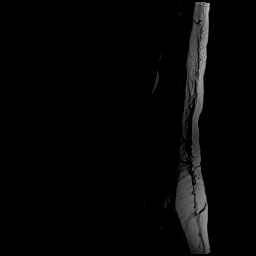
[im 12/15]
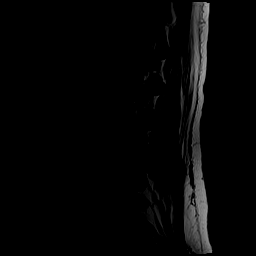
[im 15/15]
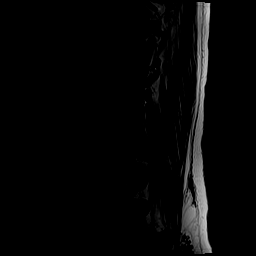

[Series 6: T2 · axial · 4.0mm · 0.39mm/px · z∈[-155,+62]mm · 10 of 42 slices shown (2 of 2)]
[im 3/42]
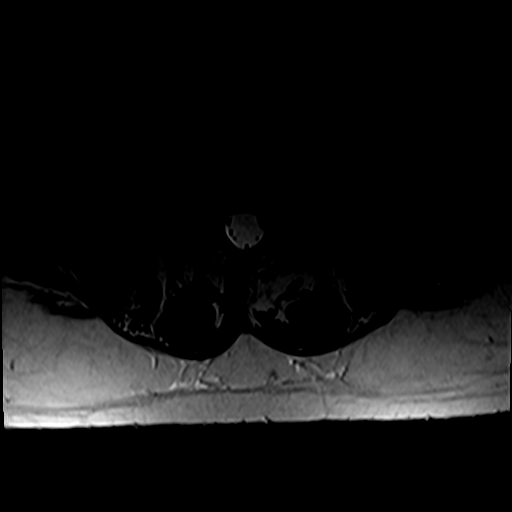
[im 6/42]
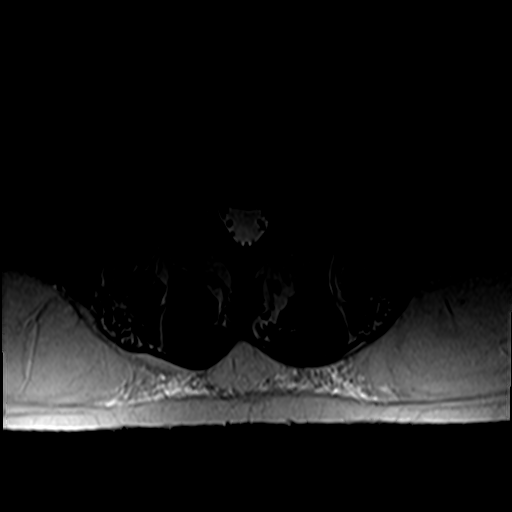
[im 9/42]
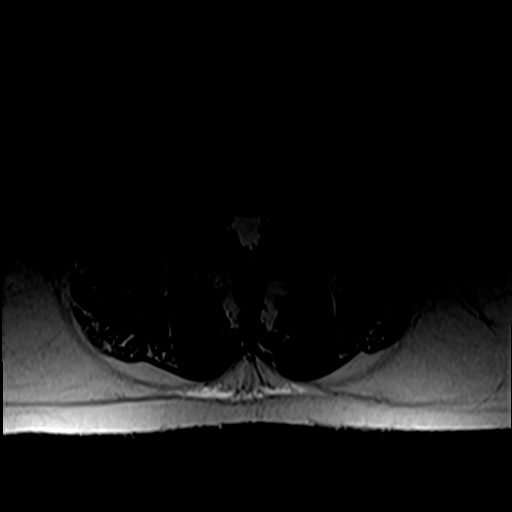
[im 14/42]
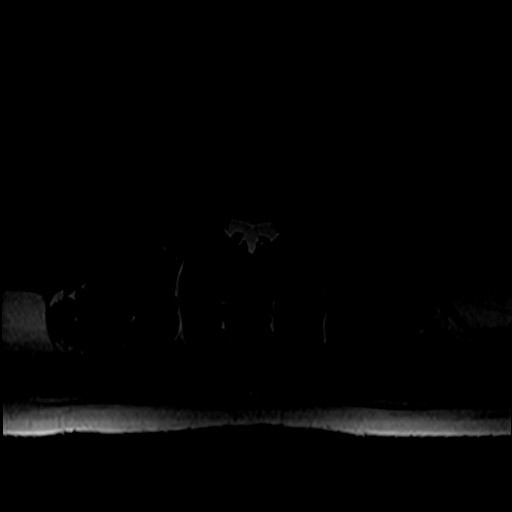
[im 20/42]
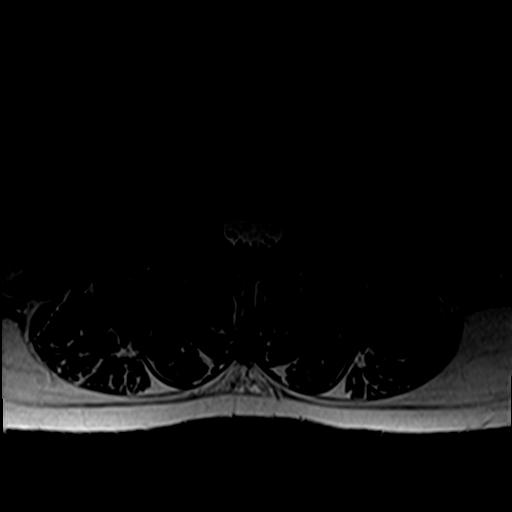
[im 22/42]
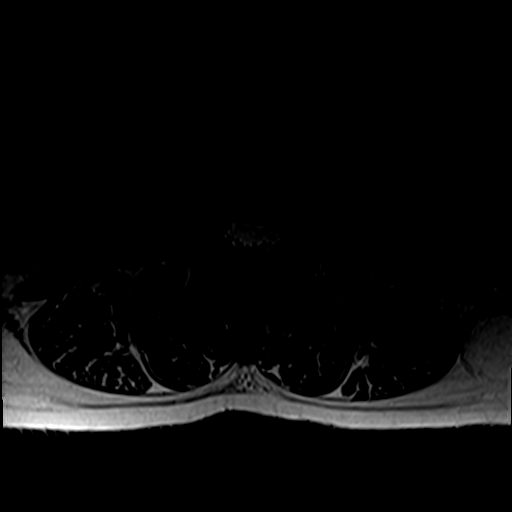
[im 25/42]
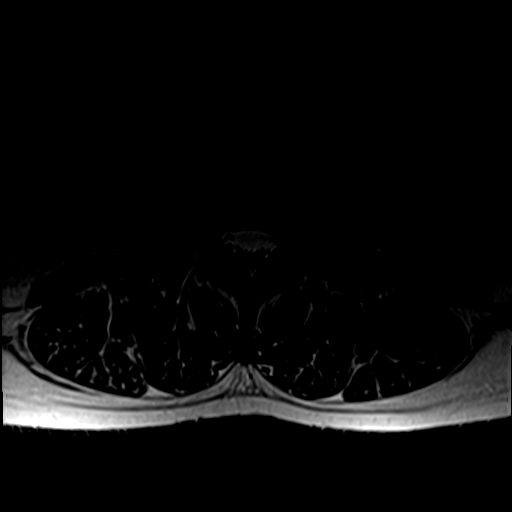
[im 31/42]
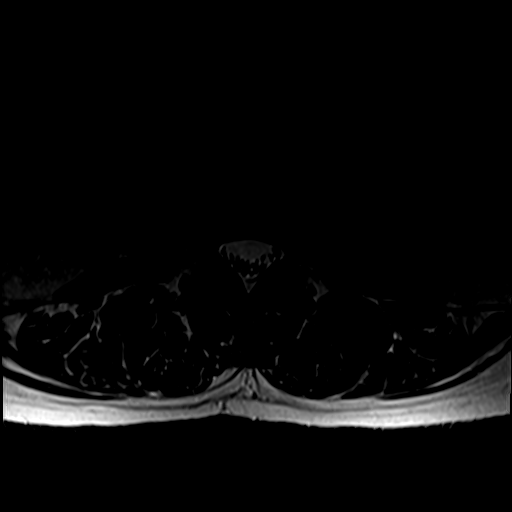
[im 36/42]
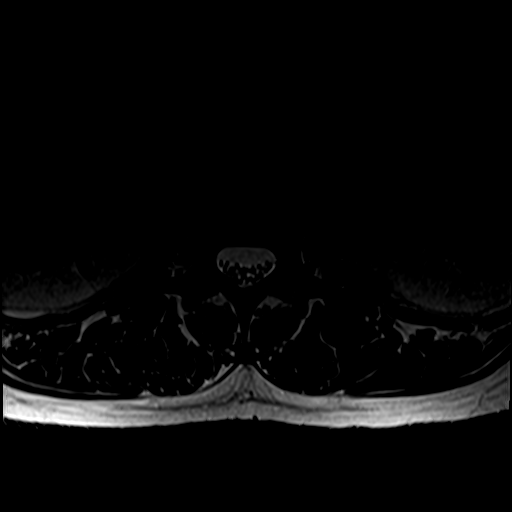
[im 42/42]
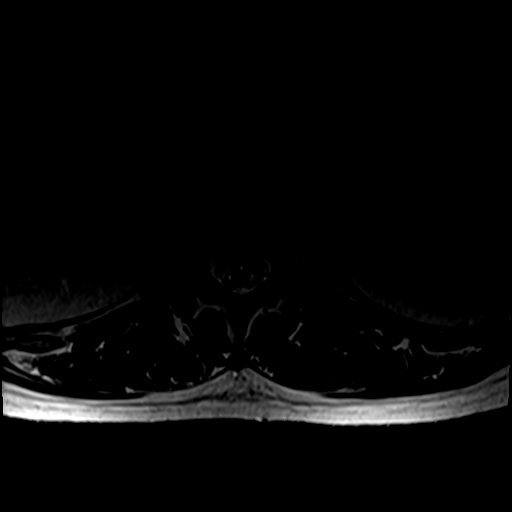

[Series 7: T1 · axial · 4.0mm · 0.39mm/px · z∈[-155,+33]mm · 6 of 42 slices shown (2 of 2)]
[im 3/42]
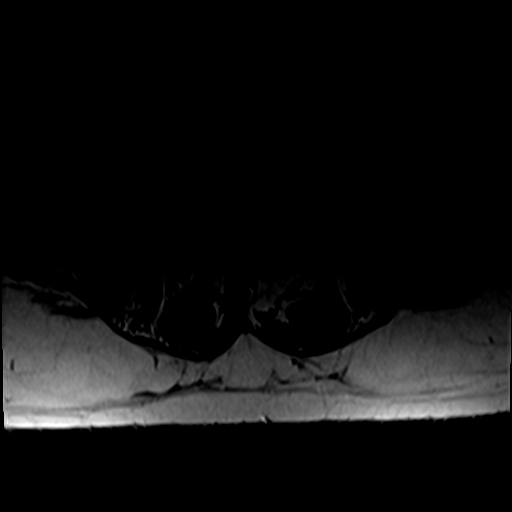
[im 6/42]
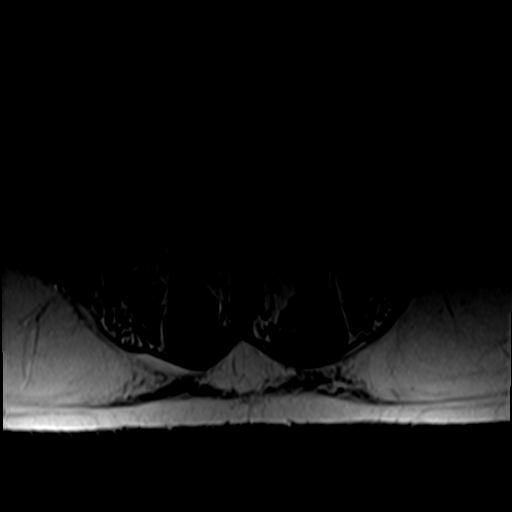
[im 9/42]
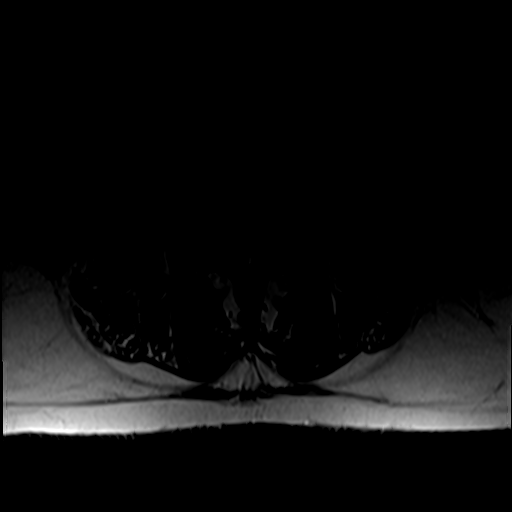
[im 14/42]
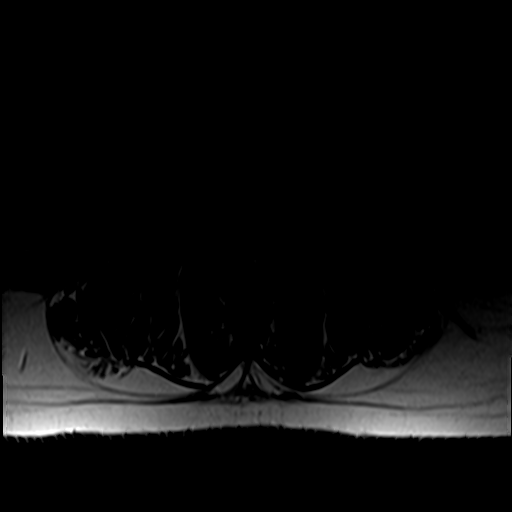
[im 22/42]
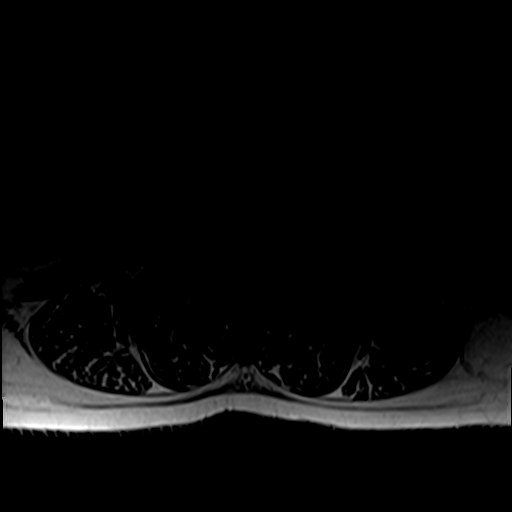
[im 36/42]
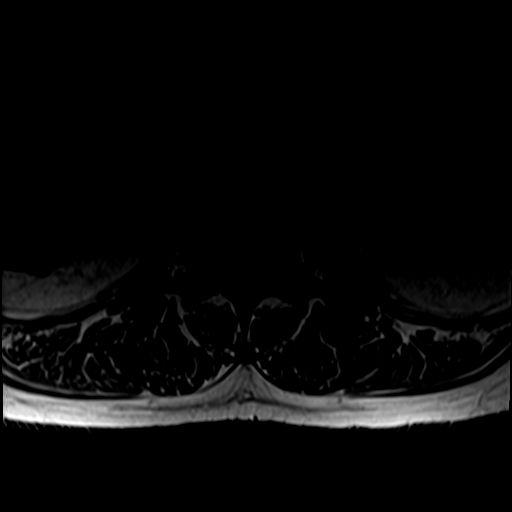

[27 of 48 positions shown; findings below may reference images not displayed]

FINDINGS: Segmentation:  Standard.

Alignment:  No significant listhesis.

Vertebrae: Mild degenerative endplate irregularity. Vertebral body
heights are otherwise maintained. There is no marrow edema. No
suspicious osseous lesion.

Conus medullaris and cauda equina: Conus extends to the T12-L1
level. Conus and cauda equina appear normal.

Paraspinal and other soft tissues: Unremarkable.

Disc levels:

L1-L2:  No canal or foraminal stenosis.

L2-L3:  No canal or foraminal stenosis.

L3-L4:  Minimal disc bulge.  No canal or foraminal stenosis.

L4-L5: Disc desiccation without height loss. Disc bulge with
endplate osteophytic ridging. No canal stenosis. Slight effacement
of subarticular recesses. Mild foraminal stenosis.

L5-S1: Disc desiccation and height loss. Disc bulge with endplate
osteophytic ridging. No canal stenosis. Minor right and mild left
foraminal stenosis.
IMPRESSION: Multilevel degenerative changes as detailed above without high-grade
stenosis. No substantial progression since remote 4050 study.

## 2022-06-30 ENCOUNTER — Telehealth: Payer: Self-pay | Admitting: Family Medicine

## 2022-06-30 DIAGNOSIS — L409 Psoriasis, unspecified: Secondary | ICD-10-CM

## 2022-06-30 NOTE — Telephone Encounter (Signed)
Pt called asking if Dr. Etter Sjogren could resubmit the referral to dermatology from last year so he can have the psoriasis addressed.

## 2022-06-30 NOTE — Telephone Encounter (Signed)
New referral sent.

## 2023-03-21 ENCOUNTER — Ambulatory Visit
Admission: EM | Admit: 2023-03-21 | Discharge: 2023-03-21 | Disposition: A | Discharge status: Home / Self Care | Payer: BC Managed Care – PPO

## 2023-03-21 DIAGNOSIS — J069 Acute upper respiratory infection, unspecified: Secondary | ICD-10-CM | POA: Diagnosis not present

## 2023-03-21 MED ORDER — PROMETHAZINE-DM 6.25-15 MG/5ML PO SYRP: 5.0000 mL | ORAL_SOLUTION | Freq: Four times a day (QID) | ORAL | 0 refills | Status: DC | PRN

## 2023-03-21 MED ORDER — ALBUTEROL SULFATE HFA 108 (90 BASE) MCG/ACT IN AERS: 1.0000 | INHALATION_SPRAY | Freq: Four times a day (QID) | RESPIRATORY_TRACT | 0 refills | Status: AC | PRN

## 2023-03-21 NOTE — Discharge Instructions (Signed)
Promethazine DM as needed for cough.  Please note this medication can make you drowsy.  Do not drink alcohol or drive while you are on this medication Albuterol inhaler as needed for wheezing or shortness of breath Rest and fluids Follow-up with your PCP if your symptoms do not improve Please go to the ER for any worsening symptoms

## 2023-03-21 NOTE — ED Provider Notes (Signed)
[facility]    CSN: [id number] Arrival date & time: 03/21/23  1306      History   Chief Complaint Chief Complaint  Patient presents with   Cough    Cough, chest congestion, and wheezing. X3-4 days    HPI Devin Ramos is a 57 y.o. male  presents for evaluation of URI symptoms for 3-4 days. Patient reports associated symptoms of cough, congestion, sore throat, and wheezing. Denies N/V/D, fevers, ear pain, body aches, shortness of breath. Patient does not have a hx of asthma or smoking. No known sick contacts.  Pt has taken DayQuil NyQuil OTC for symptoms. Pt has no other concerns at this time.    Cough Associated symptoms: sore throat     Past Medical History:  Diagnosis Date   Allergy    Environmental allergies    Eosinophilic esophagitis    Esophageal stenosis    Hiatal hernia    Lumbar herniated disc     Patient Active Problem List   Diagnosis Date Noted   Psoriasis 07/02/2021   Low back pain with radiation 07/02/2021   Esophageal obstruction due to food impaction    Esophageal dysphagia    Preventative health care 06/28/2015   Left hip pain 03/16/2015   Sinusitis, acute maxillary 11/01/2014   Pain of left hand 09/16/2013   Viral URI 11/18/2011   DISC DISEASE, LUMBAR 08/16/2009   ANEMIA, VITAMIN B12 DEFICIENCY 02/28/2009   UNSPECIFIED MONONEURITIS OF LOWER LIMB 02/21/2009   ALLERGIC RHINITIS 07/14/2007    Past Surgical History:  Procedure Laterality Date   APPENDECTOMY     ESOPHAGOGASTRODUODENOSCOPY (EGD) WITH PROPOFOL N/A 10/09/2016   Procedure: ESOPHAGOGASTRODUODENOSCOPY (EGD) WITH PROPOFOL;  Surgeon: Meryl Dare, MD;  Location: [facility] ENDOSCOPY;  Service: Endoscopy;  Laterality: N/A;   UPPER GASTROINTESTINAL ENDOSCOPY         Home Medications    Prior to Admission medications   Medication Sig Start Date End Date Taking? Authorizing Provider  albuterol (VENTOLIN HFA) 108 (90 Base) MCG/ACT inhaler Inhale 1-2 puffs into the lungs  every 6 (six) hours as needed for wheezing or shortness of breath. 03/21/23  Yes Radford Pax, NP  cetirizine (ZYRTEC) 10 MG tablet Take 10 mg by mouth daily.   Yes [provider]  loratadine (CLARITIN) 10 MG tablet Take by mouth.   Yes [provider]  omeprazole (PRILOSEC OTC) 20 MG tablet Take 20 mg by mouth daily.   Yes [provider]  promethazine-dextromethorphan (PROMETHAZINE-DM) 6.25-15 MG/5ML syrup Take 5 mLs by mouth 4 (four) times daily as needed for cough. 03/21/23  Yes Radford Pax, NP  SKYRIZI PEN 150 MG/ML SOAJ Inject into the skin. 01/01/23  Yes [provider]    Family History Family History  Problem Relation Age of Onset   Prostate cancer Father    Hypertension Father    Cancer Father        prostate   Heart disease Paternal Grandfather    Stroke Paternal Grandfather    Hypertension Mother    Diabetes Mother    Hypertension Brother    Colon cancer Neg Hx    Esophageal cancer Neg Hx    Rectal cancer Neg Hx    Stomach cancer Neg Hx     Social History Social History   Tobacco Use   Smoking status: Never   Smokeless tobacco: Never  Vaping Use   Vaping Use: Never used  Substance Use Topics   Alcohol use: Not Currently  Comment: rare   Drug use: No     Allergies   Latex   Review of Systems Review of Systems  HENT:  Positive for congestion and sore throat.   Respiratory:  Positive for cough.      Physical Exam Triage Vital Signs ED Triage Vitals  Enc Vitals Group     BP 03/21/23 1357 128/77     Pulse Rate 03/21/23 1357 74     Resp 03/21/23 1357 17     Temp 03/21/23 1357 99.1 F (37.3 C)     Temp Source 03/21/23 1357 Oral     SpO2 03/21/23 1357 95 %     Weight 03/21/23 1355 225 lb (102.1 kg)     Height 03/21/23 1355 6\' 2"  (1.88 m)     Head Circumference --      Peak Flow --      Pain Score 03/21/23 1355 0     Pain Loc --      Pain Edu? --      Excl. in GC? --    No data found.  Updated Vital  Signs BP 128/77 (BP Location: Left Arm)   Pulse 74   Temp 99.1 F (37.3 C) (Oral)   Resp 17   Ht 6\' 2"  (1.88 m)   Wt 225 lb (102.1 kg)   SpO2 95%   BMI 28.89 kg/m   Visual Acuity Right Eye Distance:   Left Eye Distance:   Bilateral Distance:    Right Eye Near:   Left Eye Near:    Bilateral Near:     Physical Exam Vitals and nursing note reviewed.  Constitutional:      General: He is not in acute distress.    Appearance: Normal appearance. He is not ill-appearing or toxic-appearing.  HENT:     Head: Normocephalic and atraumatic.     Right Ear: Tympanic membrane and ear canal normal.     Left Ear: Tympanic membrane and ear canal normal.     Nose: Congestion present.     Mouth/Throat:     Mouth: Mucous membranes are moist.     Pharynx: Posterior oropharyngeal erythema present.  Eyes:     Pupils: Pupils are equal, round, and reactive to light.  Cardiovascular:     Rate and Rhythm: Normal rate and regular rhythm.     Heart sounds: Normal heart sounds.  Pulmonary:     Effort: Pulmonary effort is normal.     Breath sounds: Normal breath sounds.  Musculoskeletal:     Cervical back: Normal range of motion and neck supple.  Lymphadenopathy:     Cervical: No cervical adenopathy.  Skin:    General: Skin is warm and dry.  Neurological:     General: No focal deficit present.     Mental Status: He is alert and oriented to person, place, and time.  Psychiatric:        Mood and Affect: Mood normal.        Behavior: Behavior normal.      UC Treatments / Results  Labs (all labs ordered are listed, but only abnormal results are displayed) Labs Reviewed - No data to display  EKG   Radiology No results found.  Procedures Procedures (including critical care time)  Medications Ordered in UC Medications - No data to display  Initial Impression / Assessment and Plan / UC Course  I have reviewed the triage vital signs and the nursing notes.  Pertinent labs & imaging  results that  were available during my care of the patient were reviewed by me and considered in my medical decision making (see chart for details).     Reviewed exam and symptoms with patient.  He is well-appearing and in no acute distress.  He declined COVID PCR.  Discussed viral illness and symptomatic treatment Promethazine DM as needed for cough.  Side effect profile reviewed Albuterol inhaler as needed.  No wheezing on exam Rest and fluids PCP follow-up if symptoms do not improve ER precautions reviewed and patient verbalized understanding Final Clinical Impressions(s) / UC Diagnoses   Final diagnoses:  Viral upper respiratory illness     Discharge Instructions      Promethazine DM as needed for cough.  Please note this medication can make you drowsy.  Do not drink alcohol or drive while you are on this medication Albuterol inhaler as needed for wheezing or shortness of breath Rest and fluids Follow-up with your PCP if your symptoms do not improve Please go to the ER for any worsening symptoms    ED Prescriptions     Medication Sig Dispense Auth. Provider   promethazine-dextromethorphan (PROMETHAZINE-DM) 6.25-15 MG/5ML syrup Take 5 mLs by mouth 4 (four) times daily as needed for cough. 118 mL Radford Pax, NP   albuterol (VENTOLIN HFA) 108 (90 Base) MCG/ACT inhaler Inhale 1-2 puffs into the lungs every 6 (six) hours as needed for wheezing or shortness of breath. 1 each Radford Pax, NP      PDMP not reviewed this encounter.   Radford Pax, NP 03/21/23 1415

## 2023-03-21 NOTE — ED Triage Notes (Signed)
Pt states that he has a cough, chest congestion, and wheezing. X3-4 days

## 2023-03-22 ENCOUNTER — Ambulatory Visit: Payer: Self-pay

## 2023-11-16 ENCOUNTER — Encounter: Payer: Self-pay | Admitting: Family Medicine

## 2023-11-16 ENCOUNTER — Ambulatory Visit (INDEPENDENT_AMBULATORY_CARE_PROVIDER_SITE_OTHER): Payer: BC Managed Care – PPO | Admitting: Family Medicine

## 2023-11-16 VITALS — BP 110/80 | HR 73 | Temp 98.3°F | Resp 16 | Ht 74.0 in | Wt 224.6 lb

## 2023-11-16 DIAGNOSIS — Z1211 Encounter for screening for malignant neoplasm of colon: Secondary | ICD-10-CM

## 2023-11-16 DIAGNOSIS — Z0000 Encounter for general adult medical examination without abnormal findings: Secondary | ICD-10-CM

## 2023-11-16 DIAGNOSIS — Z23 Encounter for immunization: Secondary | ICD-10-CM | POA: Diagnosis not present

## 2023-11-16 LAB — COMPREHENSIVE METABOLIC PANEL
ALT: 21 U/L (ref 0–53)
AST: 9 U/L (ref 0–37)
Albumin: 4.2 g/dL (ref 3.5–5.2)
Alkaline Phosphatase: 60 U/L (ref 39–117)
BUN: 14 mg/dL (ref 6–23)
CO2: 28 meq/L (ref 19–32)
Calcium: 8.7 mg/dL (ref 8.4–10.5)
Chloride: 107 meq/L (ref 96–112)
Creatinine, Ser: 0.87 mg/dL (ref 0.40–1.50)
GFR: 97.12 mL/min (ref >60.00)
Glucose, Bld: 100 mg/dL — ABNORMAL HIGH (ref 70–99)
Potassium: 4.6 meq/L (ref 3.5–5.1)
Sodium: 141 meq/L (ref 135–145)
Total Bilirubin: 0.3 mg/dL (ref 0.2–1.2)
Total Protein: 6.3 g/dL (ref 6.0–8.3)

## 2023-11-16 LAB — CBC WITH DIFFERENTIAL/PLATELET
Basophils Absolute: 0.1 10*3/uL (ref 0.0–0.1)
Basophils Relative: 1.1 % (ref 0.0–3.0)
Eosinophils Absolute: 0.2 10*3/uL (ref 0.0–0.7)
Eosinophils Relative: 2.5 % (ref 0.0–5.0)
HCT: 41.7 % (ref 39.0–52.0)
Hemoglobin: 14.2 g/dL (ref 13.0–17.0)
Lymphocytes Relative: 19.1 % (ref 12.0–46.0)
Lymphs Abs: 1.2 10*3/uL (ref 0.7–4.0)
MCHC: 34.1 g/dL (ref 30.0–36.0)
MCV: 91.1 fL (ref 78.0–100.0)
Monocytes Absolute: 0.3 10*3/uL (ref 0.1–1.0)
Monocytes Relative: 4.8 % (ref 3.0–12.0)
Neutro Abs: 4.7 10*3/uL (ref 1.4–7.7)
Neutrophils Relative %: 72.5 % (ref 43.0–77.0)
Platelets: 225 10*3/uL (ref 150.0–400.0)
RBC: 4.57 Mil/uL (ref 4.22–5.81)
RDW: 12.6 % (ref 11.5–15.5)
WBC: 6.5 10*3/uL (ref 4.0–10.5)

## 2023-11-16 LAB — LIPID PANEL
Cholesterol: 164 mg/dL (ref 0–200)
HDL: 39.3 mg/dL (ref >39.00)
LDL Cholesterol: 110 mg/dL — ABNORMAL HIGH (ref 0–99)
NonHDL: 125.1
Total CHOL/HDL Ratio: 4
Triglycerides: 78 mg/dL (ref 0.0–149.0)
VLDL: 15.6 mg/dL (ref 0.0–40.0)

## 2023-11-16 LAB — PSA: PSA: 1.09 ng/mL (ref 0.10–4.00)

## 2023-11-16 LAB — TSH: TSH: 2.24 u[IU]/mL (ref 0.35–5.50)

## 2023-11-16 NOTE — Assessment & Plan Note (Signed)
 Ghm utd Check labs  See AVS Health Maintenance  Topic Date Due   Colonoscopy  Never done   COVID-19 Vaccine (3 - Pfizer risk series) 12/02/2023 (Originally 08/20/2020)   INFLUENZA VACCINE  02/01/2024 (Originally 06/04/2023)   DTaP/Tdap/Td (4 - Td or Tdap) 11/15/2033   Hepatitis C Screening  Completed   HIV Screening  Completed   Zoster Vaccines- Shingrix   Completed   HPV VACCINES  Aged Out

## 2023-11-16 NOTE — Progress Notes (Signed)
 Established Patient Office Visit  Subjective   Patient ID: Devin Ramos, male    DOB: [birth year]  Age: 57 y.o. MRN: [id number]  Chief Complaint  Patient presents with   Annual Exam    Pt states fasting     HPI Discussed the use of AI scribe software for clinical note transcription with the patient, who gave verbal consent to proceed.  History of Present Illness   The patient, a curator by profession, presents for a routine physical examination after a significant interval since the last one. He reports no new health issues and is currently on Skyrizi for psoriasis, which he reports as being somewhat effective. He also takes over-the-counter antacids and allergy medications as needed. The patient has a history of a fractured wrist from about a decade ago, which occasionally causes pain, managed with Tylenol  or ibuprofen.  The patient has noticed a raised, non-painful lesion on his hand, which he has not yet shown to a dermatologist. He also reports being overdue for an eye exam and a dental cleaning due to insurance issues.  The patient is due for a tetanus shot, which he agrees to receive during the current visit due to frequent cuts at work. He is also overdue for a colonoscopy and does not receive flu shots.  The patient's partner, Elveria, has been experiencing depression, particularly during the month of December due to personal losses and family circumstances. The patient mentions that Carolyn's workplace is supposed to send over paperwork related to her missed days.      Patient Active Problem List   Diagnosis Date Noted   Psoriasis 07/02/2021   Low back pain with radiation 07/02/2021   Esophageal obstruction due to food impaction    Esophageal dysphagia    Preventative health care 06/28/2015   Left hip pain 03/16/2015   Sinusitis, acute maxillary 11/01/2014   Pain of left hand 09/16/2013   Viral URI 11/18/2011   DISC DISEASE, LUMBAR 08/16/2009   ANEMIA, VITAMIN B12  DEFICIENCY 02/28/2009   Mononeuritis of lower limb 02/21/2009   Allergic rhinitis 07/14/2007   Past Medical History:  Diagnosis Date   Allergy    Environmental allergies    Eosinophilic esophagitis    Esophageal stenosis    Hiatal hernia    Lumbar herniated disc    Past Surgical History:  Procedure Laterality Date   APPENDECTOMY     ESOPHAGOGASTRODUODENOSCOPY (EGD) WITH PROPOFOL  N/A 10/09/2016   Procedure: ESOPHAGOGASTRODUODENOSCOPY (EGD) WITH PROPOFOL ;  Surgeon: Gwendlyn ONEIDA Buddy, MD;  Location: [facility] ENDOSCOPY;  Service: Endoscopy;  Laterality: N/A;   UPPER GASTROINTESTINAL ENDOSCOPY     Social History   Tobacco Use   Smoking status: Never   Smokeless tobacco: Never  Vaping Use   Vaping status: Never Used  Substance Use Topics   Alcohol use: Not Currently    Comment: rare   Drug use: No   Social History   Socioeconomic History   Marital status: Married    Spouse name: Not on file   Number of children: Not on file   Years of education: Not on file   Highest education level: Not on file  Occupational History   Occupation: [employer]  Tobacco Use   Smoking status: Never   Smokeless tobacco: Never  Vaping Use   Vaping status: Never Used  Substance and Sexual Activity   Alcohol use: Not Currently    Comment: rare   Drug use: No   Sexual activity: Yes    Partners: Female  Other Topics Concern   Not on file  Social History Narrative   Exercise-- walking ,  Physical job   Social Drivers of Corporate Investment Banker Strain: Not on file  Food Insecurity: Not on file  Transportation Needs: Not on file  Physical Activity: Not on file  Stress: Not on file  Social Connections: Not on file  Intimate Partner Violence: Not on file   Family Status  Relation Name Status   Father  Alive   PGF  Deceased   Mother  Alive   Brother  Alive   MGM  Deceased   MGF  Deceased at age 56   PGM  Deceased   Brother  Alive   Neg Hx  (Not Specified)  No partnership data on  file   Family History  Problem Relation Age of Onset   Prostate cancer Father    Hypertension Father    Cancer Father        prostate   Heart disease Paternal Grandfather    Stroke Paternal Grandfather    Hypertension Mother    Diabetes Mother    Hypertension Brother    Colon cancer Neg Hx    Esophageal cancer Neg Hx    Rectal cancer Neg Hx    Stomach cancer Neg Hx    Allergies  Allergen Reactions   Latex Other (See Comments)    Eyes swell      Review of Systems  Constitutional:  Negative for chills, fever and malaise/fatigue.  HENT:  Negative for congestion and hearing loss.   Eyes:  Negative for discharge.  Respiratory:  Negative for cough, sputum production and shortness of breath.   Cardiovascular:  Negative for chest pain, palpitations and leg swelling.  Gastrointestinal:  Negative for abdominal pain, blood in stool, constipation, diarrhea, heartburn, nausea and vomiting.  Genitourinary:  Negative for dysuria, frequency, hematuria and urgency.  Musculoskeletal:  Negative for back pain, falls and myalgias.  Skin:  Negative for rash.  Neurological:  Negative for dizziness, sensory change, loss of consciousness, weakness and headaches.  Endo/Heme/Allergies:  Negative for environmental allergies. Does not bruise/bleed easily.  Psychiatric/Behavioral:  Negative for depression and suicidal ideas. The patient is not nervous/anxious and does not have insomnia.       Objective:     BP 110/80 (BP Location: Right Arm, Patient Position: Sitting, Cuff Size: Normal)   Pulse 73   Temp 98.3 F (36.8 C) (Oral)   Resp 16   Ht 6' 2 (1.88 m)   Wt 224 lb 9.6 oz (101.9 kg)   SpO2 97%   BMI 28.84 kg/m  BP Readings from Last 3 Encounters:  11/16/23 110/80  03/21/23 128/77  07/02/21 110/78   Wt Readings from Last 3 Encounters:  11/16/23 224 lb 9.6 oz (101.9 kg)  03/21/23 225 lb (102.1 kg)  07/02/21 226 lb 9.6 oz (102.8 kg)   SpO2 Readings from Last 3 Encounters:   11/16/23 97%  03/21/23 95%  07/02/21 96%      Physical Exam Vitals and nursing note reviewed.  Constitutional:      General: He is not in acute distress.    Appearance: Normal appearance. He is well-developed.  HENT:     Head: Normocephalic and atraumatic.     Right Ear: Tympanic membrane, ear canal and external ear normal. There is no impacted cerumen.     Left Ear: Tympanic membrane, ear canal and external ear normal. There is no impacted cerumen.     Nose:  Nose normal.     Mouth/Throat:     Mouth: Mucous membranes are moist.     Pharynx: Oropharynx is clear. No oropharyngeal exudate or posterior oropharyngeal erythema.  Eyes:     General: No scleral icterus.       Right eye: No discharge.        Left eye: No discharge.     Conjunctiva/sclera: Conjunctivae normal.     Pupils: Pupils are equal, round, and reactive to light.  Neck:     Thyroid : No thyromegaly.     Vascular: No JVD.  Cardiovascular:     Rate and Rhythm: Normal rate and regular rhythm.     Heart sounds: Normal heart sounds. No murmur heard. Pulmonary:     Effort: Pulmonary effort is normal. No respiratory distress.     Breath sounds: Normal breath sounds.  Abdominal:     General: Bowel sounds are normal. There is no distension.     Palpations: Abdomen is soft. There is no mass.     Tenderness: There is no abdominal tenderness. There is no guarding or rebound.  Musculoskeletal:        General: Normal range of motion.     Cervical back: Normal range of motion and neck supple.     Right lower leg: No edema.     Left lower leg: No edema.  Lymphadenopathy:     Cervical: No cervical adenopathy.  Skin:    General: Skin is warm and dry.     Findings: No erythema or rash.  Neurological:     Mental Status: He is alert and oriented to person, place, and time.     Cranial Nerves: No cranial nerve deficit.     Motor: No abnormal muscle tone.     Deep Tendon Reflexes: Reflexes are normal and symmetric. Reflexes  normal.  Psychiatric:        Mood and Affect: Mood normal.        Behavior: Behavior normal.        Thought Content: Thought content normal.        Judgment: Judgment normal.      No results found for any visits on 11/16/23.  Last CBC Lab Results  Component Value Date   WBC 6.9 07/02/2021   HGB 14.2 07/02/2021   HCT 41.1 07/02/2021   MCV 88.0 07/02/2021   MCH 30.9 01/21/2021   RDW 12.7 07/02/2021   PLT 235.0 07/02/2021   Last metabolic panel Lab Results  Component Value Date   GLUCOSE 85 07/02/2021   NA 140 07/02/2021   K 4.4 07/02/2021   CL 105 07/02/2021   CO2 28 07/02/2021   BUN 14 07/02/2021   CREATININE 0.88 07/02/2021   GFR 98.41 07/02/2021   CALCIUM 9.2 07/02/2021   PROT 6.7 07/02/2021   ALBUMIN 4.3 07/02/2021   BILITOT 0.5 07/02/2021   ALKPHOS 56 07/02/2021   AST 10 07/02/2021   ALT 29 07/02/2021   ANIONGAP 9 10/09/2016   Last lipids Lab Results  Component Value Date   CHOL 166 07/02/2021   HDL 27.40 (L) 07/02/2021   LDLCALC 100 (H) 01/21/2021   LDLDIRECT 111.0 07/02/2021   TRIG 204.0 (H) 07/02/2021   CHOLHDL 6 07/02/2021   Last hemoglobin A1c No results found for: HGBA1C Last thyroid  functions Lab Results  Component Value Date   TSH 2.19 07/02/2021   Last vitamin D No results found for: 25OHVITD2, 25OHVITD3, VD25OH Last vitamin B12 and Folate Lab Results  Component Value Date  VITAMINB12 225 02/21/2009   FOLATE 4.5 02/21/2009      The 10-year ASCVD risk score (Arnett DK, et al., 2019) is: 5.9%    Assessment & Plan:   Problem List Items Addressed This Visit       Unprioritized   Preventative health care - Primary   Relevant Orders   Lipid panel   PSA   TSH   Comprehensive metabolic panel   CBC with Differential/Platelet   Other Visit Diagnoses       Need for tetanus booster       Relevant Orders   Tdap vaccine greater than or equal to 7yo IM     Colon cancer screening       Relevant Orders   Ambulatory  referral to Gastroenterology     Assessment and Plan    Psoriasis Skyrizi injections are effective and preferred over oral medication. Bruising occurs at the injection site, but no other significant issues are reported. The benefits and potential side effects, including bruising, were discussed. Oral medication was declined. Continue Skyrizi injections and monitor for adverse effects.  Ganglion Cyst A raised, calloused area on the hand is likely a ganglion cyst, presenting no pain or significant symptoms. The benign nature of ganglion cysts was discussed, with intervention advised only if symptomatic. A dermatologist consultation is recommended for further evaluation. Monitor for changes in size or pain.  General Health Maintenance A routine physical examination revealed overdue colonoscopy, flu shot, and tetanus booster. The last dental appointment was missed due to insurance issues. The importance of colorectal cancer screening and flu vaccination was discussed. A tetanus booster is agreed upon due to frequent cuts at work. Schedule a colonoscopy, administer the tetanus booster, and encourage an annual flu shot. Recommend finding a new dentist if the current one refuses to clean teeth without x-rays.  Follow-up Call Elveria to schedule a virtual appointment for paperwork completion.        No follow-ups on file.    Landree Fernholz R Lowne Chase, DO

## 2023-11-22 ENCOUNTER — Encounter: Payer: Self-pay | Admitting: Family Medicine

## 2023-11-27 ENCOUNTER — Encounter: Payer: Self-pay | Admitting: Internal Medicine

## 2023-12-07 ENCOUNTER — Ambulatory Visit (AMBULATORY_SURGERY_CENTER): Payer: BC Managed Care – PPO

## 2023-12-07 VITALS — Ht 74.0 in | Wt 225.0 lb

## 2023-12-07 DIAGNOSIS — Z1211 Encounter for screening for malignant neoplasm of colon: Secondary | ICD-10-CM

## 2023-12-07 MED ORDER — SUFLAVE 178.7 G PO SOLR: 1.0000 | ORAL | 0 refills | Status: DC

## 2023-12-07 MED ORDER — SUFLAVE 178.7 G PO SOLR
1.0000 | ORAL | 0 refills | Status: DC
Start: 2023-12-07 — End: 2023-12-07

## 2023-12-07 NOTE — Progress Notes (Signed)
 No egg or soy allergy known to patient  No issues known to pt with past sedation with any surgeries or procedures Patient denies ever being told they had issues or difficulty with intubation  No FH of Malignant Hyperthermia Pt is not on diet pills Pt is not on  home 02  Pt is not on blood thinners  Pt denies issues with constipation  No A fib or A flutter Have any cardiac testing pending-- no  LOA: independent  Prep: suflave   Patient's chart reviewed by Cathlyn Parsons CNRA prior to previsit and patient appropriate for the [facility].  Previsit completed and red dot placed by patient's name on their procedure day (on provider's schedule).     PV completed with patient. Prep instructions sent to home address.

## 2023-12-08 ENCOUNTER — Encounter: Payer: Self-pay | Admitting: Internal Medicine

## 2023-12-08 ENCOUNTER — Telehealth: Payer: Self-pay

## 2023-12-08 NOTE — Telephone Encounter (Signed)
 Attempted to reach patient concerning the need for him to call [facility] in order to receive his prep prior to his procedure; left message of [facility]'s number of [phone] for patient to call them as soon as possible; will attempt to reach patient at a later date/time for follow up;

## 2023-12-09 ENCOUNTER — Encounter: Payer: Self-pay | Admitting: Internal Medicine

## 2023-12-15 NOTE — Progress Notes (Unsigned)
[facility] History and Physical   Primary Care Physician:  Zola Button, Grayling Congress, DO   Reason for Procedure:  Colon cancer screening  Plan:    Colonoscopy     HPI: Devin Ramos is a 58 y.o. male with a history of possible eosinophilic esophagitis (2018 Dr. Russella Dar) who presents for a screening colonoscopy   Past Medical History:  Diagnosis Date   Allergy    Environmental allergies    Eosinophilic esophagitis    Esophageal stenosis    Hiatal hernia    Lumbar herniated disc     Past Surgical History:  Procedure Laterality Date   APPENDECTOMY     ESOPHAGOGASTRODUODENOSCOPY (EGD) WITH PROPOFOL N/A 10/09/2016   Procedure: ESOPHAGOGASTRODUODENOSCOPY (EGD) WITH PROPOFOL;  Surgeon: Meryl Dare, MD;  Location: [facility] ENDOSCOPY;  Service: Endoscopy;  Laterality: N/A;   UPPER GASTROINTESTINAL ENDOSCOPY      Prior to Admission medications   Medication Sig Start Date End Date Taking? Authorizing Provider  albuterol (VENTOLIN HFA) 108 (90 Base) MCG/ACT inhaler Inhale 1-2 puffs into the lungs every 6 (six) hours as needed for wheezing or shortness of breath. Patient not taking: Reported on 12/07/2023 03/21/23   Radford Pax, NP  cetirizine (ZYRTEC) 10 MG tablet Take 10 mg by mouth daily.    [provider]  loratadine (CLARITIN) 10 MG tablet Take by mouth.    [provider]  omeprazole (PRILOSEC OTC) 20 MG tablet Take 20 mg by mouth daily.    [provider]  PEG 3350-KCl-NaCl-NaSulf-MgSul (SUFLAVE) 178.7 g SOLR Take 1 kit by mouth as directed. 12/07/23   Iva Boop, MD  SKYRIZI PEN 150 MG/ML SOAJ Inject into the skin. 01/01/23   [provider]    Current Outpatient Medications  Medication Sig Dispense Refill   omeprazole (PRILOSEC OTC) 20 MG tablet Take 20 mg by mouth daily.     albuterol (VENTOLIN HFA) 108 (90 Base) MCG/ACT inhaler Inhale 1-2 puffs into the lungs every 6 (six) hours as needed for wheezing or shortness of breath.  (Patient not taking: Reported on 12/07/2023) 1 each 0   cetirizine (ZYRTEC) 10 MG tablet Take 10 mg by mouth daily. (Patient not taking: Reported on 12/16/2023)     loratadine (CLARITIN) 10 MG tablet Take by mouth. (Patient not taking: Reported on 12/16/2023)     SKYRIZI PEN 150 MG/ML SOAJ Inject into the skin.     Current Facility-Administered Medications  Medication Dose Route Frequency Provider Last Rate Last Admin   0.9 %  sodium chloride infusion  500 mL Intravenous Continuous Iva Boop, MD        Allergies as of 12/16/2023 - Review Complete 12/16/2023  Allergen Reaction Noted   Latex Other (See Comments) 02/20/2012    Family History  Problem Relation Age of Onset   Prostate cancer Father    Hypertension Father    Cancer Father        prostate   Heart disease Paternal Grandfather    Stroke Paternal Grandfather    Hypertension Mother    Diabetes Mother    Hypertension Brother    Colon cancer Neg Hx    Esophageal cancer Neg Hx    Rectal cancer Neg Hx    Stomach cancer Neg Hx     Social History   Socioeconomic History   Marital status: Married    Spouse name: Not on file   Number of children: Not on file   Years of education: Not on  file   Highest education level: Not on file  Occupational History   Occupation: [employer]  Tobacco Use   Smoking status: Never   Smokeless tobacco: Never  Vaping Use   Vaping status: Never Used  Substance and Sexual Activity   Alcohol use: Not Currently    Comment: rare   Drug use: No   Sexual activity: Yes    Partners: Female  Other Topics Concern   Not on file  Social History Narrative   Exercise-- walking ,  Physical job   Social Drivers of Corporate investment banker Strain: Not on file  Food Insecurity: Not on file  Transportation Needs: Not on file  Physical Activity: Not on file  Stress: Not on file  Social Connections: Not on file  Intimate Partner Violence: Not on file    Review of Systems:  All other  review of systems negative except as mentioned in the HPI.  Physical Exam: Vital signs BP 107/73   Pulse 70   Temp 97.7 F (36.5 C) (Temporal)   Resp 17   Wt 215 lb (97.5 kg)   SpO2 99%   BMI 27.60 kg/m   General:   Alert,  Well-developed, well-nourished, pleasant and cooperative in NAD Lungs:  Clear throughout to auscultation.   Heart:  Regular rate and rhythm; no murmurs, clicks, rubs,  or gallops. Abdomen:  Soft, nontender and nondistended. Normal bowel sounds.   Neuro/Psych:  Alert and cooperative. Normal mood and affect. A and O x 3   @Shahil Speegle  Sena Slate, MD, [facility] [phone] (pager) 12/16/2023 2:43 PM@

## 2023-12-16 ENCOUNTER — Encounter: Payer: Self-pay | Admitting: Internal Medicine

## 2023-12-16 ENCOUNTER — Ambulatory Visit: Payer: BC Managed Care – PPO | Admitting: Internal Medicine

## 2023-12-16 VITALS — BP 123/76 | HR 76 | Temp 97.7°F | Resp 12 | Wt 215.0 lb

## 2023-12-16 DIAGNOSIS — D128 Benign neoplasm of rectum: Secondary | ICD-10-CM | POA: Diagnosis not present

## 2023-12-16 DIAGNOSIS — Z1211 Encounter for screening for malignant neoplasm of colon: Secondary | ICD-10-CM | POA: Diagnosis present

## 2023-12-16 DIAGNOSIS — D124 Benign neoplasm of descending colon: Secondary | ICD-10-CM

## 2023-12-16 DIAGNOSIS — D125 Benign neoplasm of sigmoid colon: Secondary | ICD-10-CM

## 2023-12-16 DIAGNOSIS — K635 Polyp of colon: Secondary | ICD-10-CM | POA: Diagnosis not present

## 2023-12-16 DIAGNOSIS — D123 Benign neoplasm of transverse colon: Secondary | ICD-10-CM

## 2023-12-16 MED ORDER — SODIUM CHLORIDE 0.9 % IV SOLN
500.0000 mL | INTRAVENOUS | Status: DC
Start: 2023-12-16 — End: 2023-12-16

## 2023-12-16 NOTE — Progress Notes (Signed)
Called to room to assist during endoscopic procedure.  Patient ID and intended procedure confirmed with present staff. Received instructions for my participation in the procedure from the performing physician.

## 2023-12-16 NOTE — Patient Instructions (Addendum)
I found and removed 6 small polyps that look benign.  I will let you know pathology results and when to have another routine colonoscopy by mail and/or My Chart.  All else ok - normal.  I appreciate the opportunity to care for you. Iva Boop, MD, [facility]  Handout provided on polyps.    YOU HAD AN ENDOSCOPIC PROCEDURE TODAY AT THE [facility]:   Refer to the procedure report that was given to you for any specific questions about what was found during the examination.  If the procedure report does not answer your questions, please call your gastroenterologist to clarify.  If you requested that your care partner not be given the details of your procedure findings, then the procedure report has been included in a sealed envelope for you to review at your convenience later.  YOU SHOULD EXPECT: Some feelings of bloating in the abdomen. Passage of more gas than usual.  Walking can help get rid of the air that was put into your GI tract during the procedure and reduce the bloating. If you had a lower endoscopy (such as a colonoscopy or flexible sigmoidoscopy) you may notice spotting of blood in your stool or on the toilet paper. If you underwent a bowel prep for your procedure, you may not have a normal bowel movement for a few days.  Please Note:  You might notice some irritation and congestion in your nose or some drainage.  This is from the oxygen used during your procedure.  There is no need for concern and it should clear up in a day or so.  SYMPTOMS TO REPORT IMMEDIATELY:  Following lower endoscopy (colonoscopy or flexible sigmoidoscopy):  Excessive amounts of blood in the stool  Significant tenderness or worsening of abdominal pains  Swelling of the abdomen that is new, acute  Fever of 100F or higher  For urgent or emergent issues, a gastroenterologist can be reached at any hour by calling [phone]. Do not use MyChart messaging for urgent concerns.    DIET:  We do  recommend a small meal at first, but then you may proceed to your regular diet.  Drink plenty of fluids but you should avoid alcoholic beverages for 24 hours.  ACTIVITY:  You should plan to take it easy for the rest of today and you should NOT DRIVE or use heavy machinery until tomorrow (because of the sedation medicines used during the test).    FOLLOW UP: Our staff will call the number listed on your records the next business day following your procedure.  We will call around 7:15- 8:00 am to check on you and address any questions or concerns that you may have regarding the information given to you following your procedure. If we do not reach you, we will leave a message.     If any biopsies were taken you will be contacted by phone or by letter within the next 1-3 weeks.  Please call us at [phone] if you have not heard about the biopsies in 3 weeks.    SIGNATURES/CONFIDENTIALITY: You and/or your care partner have signed paperwork which will be entered into your electronic medical record.  These signatures attest to the fact that that the information above on your After Visit Summary has been reviewed and is understood.  Full responsibility of the confidentiality of this discharge information lies with you and/or your care-partner.

## 2023-12-16 NOTE — Progress Notes (Signed)
Report to PACU, RN, vss, BBS= Clear.

## 2023-12-16 NOTE — Op Note (Addendum)
[facility] Patient Name: Devin Ramos Procedure Date: 12/16/2023 2:12 PM MRN: [id number] Endoscopist: Iva Boop , MD, [id number] Age: 58 Referring MD:  Date of Birth: [birth year] Gender: Male Account #: [id number] Procedure:                Colonoscopy Indications:              Screening for colorectal malignant neoplasm, This                            is the patient's first colonoscopy Medicines:                Monitored Anesthesia Care Procedure:                Pre-Anesthesia Assessment:                           - Prior to the procedure, a History and Physical                            was performed, and patient medications and                            allergies were reviewed. The patient's tolerance of                            previous anesthesia was also reviewed. The risks                            and benefits of the procedure and the sedation                            options and risks were discussed with the patient.                            All questions were answered, and informed consent                            was obtained. Prior Anticoagulants: The patient has                            taken no anticoagulant or antiplatelet agents. ASA                            Grade Assessment: II - A patient with mild systemic                            disease. After reviewing the risks and benefits,                            the patient was deemed in satisfactory condition to                            undergo the procedure.  After obtaining informed consent, the colonoscope                            was passed under direct vision. Throughout the                            procedure, the patient's blood pressure, pulse, and                            oxygen saturations were monitored continuously. The                            Olympus Scope SN [id number] was introduced through the                            anus and advanced to  the the cecum, identified by                            appendiceal orifice and ileocecal valve. The                            colonoscopy was performed without difficulty. The                            patient tolerated the procedure well. The quality                            of the bowel preparation was good. The ileocecal                            valve, appendiceal orifice, and rectum were                            photographed. The bowel preparation used was                            SUFLAVE via split dose instruction. Scope In: 2:53:52 PM Scope Out: 3:07:25 PM Scope Withdrawal Time: 0 hours 12 minutes 29 seconds  Total Procedure Duration: 0 hours 13 minutes 33 seconds  Findings:                 The perianal and digital rectal examinations were                            normal.                           Six sessile polyps were found in the rectum,                            sigmoid colon, descending colon and splenic                            flexure. The polyps were 3 to 10 mm in size. These  polyps were removed with a cold snare. Resection                            and retrieval were complete. Verification of                            patient identification for the specimen was done.                            Estimated blood loss was minimal.                           The exam was otherwise without abnormality on                            direct and retroflexion views. Complications:            No immediate complications. Estimated Blood Loss:     Estimated blood loss was minimal. Impression:               - Six 3 to 10 mm polyps in the rectum (1), in the                            sigmoid colon (2), in the descending colon(1)and at                            the splenic flexure (2), removed with a cold snare.                            Resected and retrieved.                           - The examination was otherwise normal on direct                             and retroflexion views. Recommendation:           - Patient has a contact number available for                            emergencies. The signs and symptoms of potential                            delayed complications were discussed with the                            patient. Return to normal activities tomorrow.                            Written discharge instructions were provided to the                            patient.                           -  Resume previous diet.                           - Continue present medications.                           - Await pathology results.                           - Repeat colonoscopy is recommended for                            surveillance. The colonoscopy date will be                            determined after pathology results from today's                            exam become available for review. Iva Boop, MD 12/16/2023 3:14:23 PM This report has been signed electronically.

## 2023-12-16 NOTE — Progress Notes (Signed)
Pt's states no medical or surgical changes since previsit or office visit.

## 2023-12-17 ENCOUNTER — Telehealth: Payer: Self-pay

## 2023-12-17 NOTE — Telephone Encounter (Signed)
  Follow up Call-     12/16/2023    1:46 PM  Call back number  Post procedure Call Back phone  # [phone]  Permission to leave phone message Yes     Patient questions:  Do you have a fever, pain , or abdominal swelling? No. Pain Score  0 *  Have you tolerated food without any problems? Yes.    Have you been able to return to your normal activities? Yes.    Do you have any questions about your discharge instructions: Diet   No. Medications  No. Follow up visit  No.  Do you have questions or concerns about your Care? No.  Actions: * If pain score is 4 or above: No action needed, pain <4.

## 2023-12-21 LAB — SURGICAL PATHOLOGY

## 2023-12-22 ENCOUNTER — Encounter: Payer: Self-pay | Admitting: Internal Medicine

## 2023-12-22 DIAGNOSIS — Z860101 Personal history of adenomatous and serrated colon polyps: Secondary | ICD-10-CM

## 2023-12-22 HISTORY — DX: Personal history of adenomatous and serrated colon polyps: Z86.0101
